# Patient Record
Sex: Female | Born: 1980 | Race: White | Hispanic: No | Marital: Single | Smoking: Current every day smoker
Health system: Southern US, Community
[De-identification: ages and names within clinical notes are randomized; demographics above are authoritative.]

## PROBLEM LIST (undated history)

## (undated) DIAGNOSIS — Z87442 Personal history of urinary calculi: Secondary | ICD-10-CM

## (undated) DIAGNOSIS — M419 Scoliosis, unspecified: Secondary | ICD-10-CM

## (undated) DIAGNOSIS — N200 Calculus of kidney: Secondary | ICD-10-CM

## (undated) DIAGNOSIS — T7840XA Allergy, unspecified, initial encounter: Secondary | ICD-10-CM

## (undated) DIAGNOSIS — M5430 Sciatica, unspecified side: Secondary | ICD-10-CM

## (undated) DIAGNOSIS — N159 Renal tubulo-interstitial disease, unspecified: Secondary | ICD-10-CM

## (undated) DIAGNOSIS — F329 Major depressive disorder, single episode, unspecified: Principal | ICD-10-CM

## (undated) HISTORY — PX: CHOLECYSTECTOMY: SHX55

## (undated) HISTORY — DX: Major depressive disorder, single episode, unspecified: F32.9

## (undated) HISTORY — PX: KIDNEY STONE SURGERY: SHX686

---

## 2005-02-14 ENCOUNTER — Observation Stay (HOSPITAL_COMMUNITY): Admission: EM | Admit: 2005-02-14 | Discharge: 2005-02-16 | Payer: Self-pay | Admitting: Emergency Medicine

## 2008-08-09 ENCOUNTER — Other Ambulatory Visit: Admission: RE | Admit: 2008-08-09 | Discharge: 2008-08-09 | Payer: Self-pay | Admitting: Obstetrics and Gynecology

## 2009-02-21 ENCOUNTER — Inpatient Hospital Stay (HOSPITAL_COMMUNITY): Admission: AD | Admit: 2009-02-21 | Discharge: 2009-02-21 | Payer: Self-pay | Admitting: Obstetrics and Gynecology

## 2009-02-21 ENCOUNTER — Ambulatory Visit: Payer: Self-pay | Admitting: Family Medicine

## 2009-03-01 ENCOUNTER — Ambulatory Visit: Payer: Self-pay | Admitting: Family Medicine

## 2009-03-01 ENCOUNTER — Inpatient Hospital Stay (HOSPITAL_COMMUNITY): Admission: AD | Admit: 2009-03-01 | Discharge: 2009-03-04 | Payer: Self-pay | Admitting: Obstetrics & Gynecology

## 2009-10-11 ENCOUNTER — Inpatient Hospital Stay (HOSPITAL_COMMUNITY): Admission: EM | Admit: 2009-10-11 | Discharge: 2009-10-12 | Payer: Self-pay | Admitting: Emergency Medicine

## 2009-10-12 ENCOUNTER — Encounter (INDEPENDENT_AMBULATORY_CARE_PROVIDER_SITE_OTHER): Payer: Self-pay | Admitting: General Surgery

## 2009-11-25 ENCOUNTER — Emergency Department (HOSPITAL_COMMUNITY): Admission: EM | Admit: 2009-11-25 | Discharge: 2009-11-25 | Payer: Self-pay | Admitting: Emergency Medicine

## 2010-02-27 ENCOUNTER — Emergency Department (HOSPITAL_COMMUNITY)
Admission: EM | Admit: 2010-02-27 | Discharge: 2010-02-27 | Payer: Self-pay | Source: Home / Self Care | Admitting: Emergency Medicine

## 2010-02-27 LAB — URINALYSIS, ROUTINE W REFLEX MICROSCOPIC
Hemoglobin, Urine: NEGATIVE
Ketones, ur: 15 mg/dL — AB
Nitrite: NEGATIVE
Protein, ur: NEGATIVE mg/dL
Specific Gravity, Urine: 1.025 (ref 1.005–1.030)
Urine Glucose, Fasting: NEGATIVE mg/dL
Urobilinogen, UA: 1 mg/dL (ref 0.0–1.0)
pH: 6.5 (ref 5.0–8.0)

## 2010-02-27 LAB — BASIC METABOLIC PANEL
BUN: 6 mg/dL (ref 6–23)
CO2: 26 mEq/L (ref 19–32)
Calcium: 9.9 mg/dL (ref 8.4–10.5)
Chloride: 102 mEq/L (ref 96–112)
Creatinine, Ser: 0.63 mg/dL (ref 0.4–1.2)
GFR calc Af Amer: >60 mL/min (ref >60)
GFR calc non Af Amer: >60 mL/min (ref >60)
Glucose, Bld: 93 mg/dL (ref 70–99)
Potassium: 4.1 mEq/L (ref 3.5–5.1)
Sodium: 138 mEq/L (ref 135–145)

## 2010-02-27 LAB — CBC
HCT: 43.8 % (ref 36.0–46.0)
Hemoglobin: 15.3 g/dL — ABNORMAL HIGH (ref 12.0–15.0)
MCH: 31.8 pg (ref 26.0–34.0)
MCHC: 34.9 g/dL (ref 30.0–36.0)
MCV: 91.1 fL (ref 78.0–100.0)
Platelets: 272 10*3/uL (ref 150–400)
RBC: 4.81 MIL/uL (ref 3.87–5.11)
RDW: 13.6 % (ref 11.5–15.5)
WBC: 5.4 10*3/uL (ref 4.0–10.5)

## 2010-02-27 LAB — PREGNANCY, URINE: Preg Test, Ur: NEGATIVE

## 2010-02-27 LAB — HEPATIC FUNCTION PANEL
ALT: 41 U/L — ABNORMAL HIGH (ref 0–35)
AST: 33 U/L (ref 0–37)
Albumin: 4.6 g/dL (ref 3.5–5.2)
Alkaline Phosphatase: 67 U/L (ref 39–117)
Bilirubin, Direct: 0.2 mg/dL (ref 0.0–0.3)
Indirect Bilirubin: 0.7 mg/dL (ref 0.3–0.9)
Total Bilirubin: 0.9 mg/dL (ref 0.3–1.2)
Total Protein: 8.2 g/dL (ref 6.0–8.3)

## 2010-02-27 LAB — POCT PREGNANCY, URINE: Preg Test, Ur: NEGATIVE

## 2010-02-27 LAB — LIPASE, BLOOD: Lipase: 18 U/L (ref 11–59)

## 2010-02-28 LAB — DIFFERENTIAL
Basophils Absolute: 0 10*3/uL (ref 0.0–0.1)
Basophils Relative: 0 % (ref 0–1)
Eosinophils Absolute: 0.1 10*3/uL (ref 0.0–0.7)
Eosinophils Relative: 1 % (ref 0–5)
Lymphocytes Relative: 23 % (ref 12–46)
Lymphs Abs: 1.2 10*3/uL (ref 0.7–4.0)
Monocytes Absolute: 0.7 10*3/uL (ref 0.1–1.0)
Monocytes Relative: 13 % — ABNORMAL HIGH (ref 3–12)
Neutro Abs: 3.4 10*3/uL (ref 1.7–7.7)
Neutrophils Relative %: 63 % (ref 43–77)

## 2010-05-10 LAB — COMPREHENSIVE METABOLIC PANEL
ALT: 32 U/L (ref 0–35)
AST: 34 U/L (ref 0–37)
Albumin: 4.3 g/dL (ref 3.5–5.2)
Alkaline Phosphatase: 63 U/L (ref 39–117)
BUN: 6 mg/dL (ref 6–23)
CO2: 25 mEq/L (ref 19–32)
Calcium: 10.3 mg/dL (ref 8.4–10.5)
Chloride: 104 mEq/L (ref 96–112)
Creatinine, Ser: 0.62 mg/dL (ref 0.4–1.2)
GFR calc Af Amer: >60 mL/min (ref >60)
GFR calc non Af Amer: >60 mL/min (ref >60)
Glucose, Bld: 96 mg/dL (ref 70–99)
Potassium: 4.4 mEq/L (ref 3.5–5.1)
Sodium: 138 mEq/L (ref 135–145)
Total Bilirubin: 0.7 mg/dL (ref 0.3–1.2)
Total Protein: 7.2 g/dL (ref 6.0–8.3)

## 2010-05-10 LAB — DIFFERENTIAL
Basophils Absolute: 0 10*3/uL (ref 0.0–0.1)
Basophils Relative: 0 % (ref 0–1)
Eosinophils Absolute: 0.3 10*3/uL (ref 0.0–0.7)
Eosinophils Relative: 1 % (ref 0–5)
Lymphocytes Relative: 12 % (ref 12–46)
Lymphs Abs: 2.2 10*3/uL (ref 0.7–4.0)
Monocytes Absolute: 0.9 10*3/uL (ref 0.1–1.0)
Monocytes Relative: 5 % (ref 3–12)
Neutro Abs: 15.4 10*3/uL — ABNORMAL HIGH (ref 1.7–7.7)
Neutrophils Relative %: 82 % — ABNORMAL HIGH (ref 43–77)

## 2010-05-10 LAB — URINALYSIS, ROUTINE W REFLEX MICROSCOPIC
Bilirubin Urine: NEGATIVE
Glucose, UA: NEGATIVE mg/dL
Hgb urine dipstick: NEGATIVE
Ketones, ur: NEGATIVE mg/dL
Nitrite: NEGATIVE
Protein, ur: NEGATIVE mg/dL
Specific Gravity, Urine: 1.02 (ref 1.005–1.030)
Urobilinogen, UA: 0.2 mg/dL (ref 0.0–1.0)
pH: 8 (ref 5.0–8.0)

## 2010-05-10 LAB — CBC
HCT: 43.6 % (ref 36.0–46.0)
Hemoglobin: 14.6 g/dL (ref 12.0–15.0)
MCH: 30.8 pg (ref 26.0–34.0)
MCHC: 33.6 g/dL (ref 30.0–36.0)
MCV: 91.6 fL (ref 78.0–100.0)
Platelets: 342 10*3/uL (ref 150–400)
RBC: 4.76 MIL/uL (ref 3.87–5.11)
RDW: 13.6 % (ref 11.5–15.5)
WBC: 18.8 10*3/uL — ABNORMAL HIGH (ref 4.0–10.5)

## 2010-05-10 LAB — LIPASE, BLOOD: Lipase: 29 U/L (ref 11–59)

## 2010-05-10 LAB — POCT PREGNANCY, URINE: Preg Test, Ur: NEGATIVE

## 2010-05-12 LAB — RPR: RPR Ser Ql: NONREACTIVE

## 2010-05-12 LAB — RH IMMUNE GLOB WKUP(>/=20WKS)(NOT WOMEN'S HOSP): Fetal Screen: NEGATIVE

## 2010-05-12 LAB — CBC
HCT: 29.5 % — ABNORMAL LOW (ref 36.0–46.0)
HCT: 40.3 % (ref 36.0–46.0)
Hemoglobin: 10.2 g/dL — ABNORMAL LOW (ref 12.0–15.0)
Hemoglobin: 13.7 g/dL (ref 12.0–15.0)
MCHC: 34 g/dL (ref 30.0–36.0)
MCHC: 34.4 g/dL (ref 30.0–36.0)
MCV: 93 fL (ref 78.0–100.0)
MCV: 93.7 fL (ref 78.0–100.0)
Platelets: 187 10*3/uL (ref 150–400)
Platelets: 241 10*3/uL (ref 150–400)
RBC: 3.15 MIL/uL — ABNORMAL LOW (ref 3.87–5.11)
RBC: 4.33 MIL/uL (ref 3.87–5.11)
RDW: 13.7 % (ref 11.5–15.5)
RDW: 14.3 % (ref 11.5–15.5)
WBC: 20 10*3/uL — ABNORMAL HIGH (ref 4.0–10.5)
WBC: 21.1 10*3/uL — ABNORMAL HIGH (ref 4.0–10.5)

## 2010-07-12 NOTE — H&P (Signed)
NAME:  Karla Oconnor, HUYETT               ACCOUNT NO.:  [id number]   MEDICAL RECORD NO.:  [id number]          PATIENT TYPE:  INP   LOCATION:  A206                          FACILITY:  [facility]   PHYSICIAN:  Mobolaji B. Bakare, M.D.DATE OF BIRTH:  [birth year]   DATE OF ADMISSION:  02/15/2005  DATE OF DISCHARGE:  02/16/2005                                HISTORY & PHYSICAL   PRIMARY CARE PHYSICIAN:  Dr. Sherryll Burger.   CHIEF COMPLAINT:  Myalgias.   HISTORY OF PRESENT ILLNESS:  Karla Oconnor is a 30 year old Caucasian female who  has been on chronically addicted to hydrocodone. She uses 20 to 30 tablets  of 7.5 to 10 of hydrocodone every day. She gets this from Vicodin, Lorcet,  Lortab. Sometimes when she runs out of hydrocodone-based tablets, she uses  Percocet. She has tried OxyContin in the past, but she does not care for  this. She decided to quit using these drugs; last use was about 30 hours  ago. The patient started experiencing diarrhea, rhinorrhea, watery eyes,  myalgia, abdominal cramps and tremors in the past 24 hours. There has been  no vomiting and no nausea. She has moved her bowel about three times today.  She is currently feeling very miserable. She has no appetite. There is no  fever, no chills.  The patient denies IV drug use. She does not use cocaine, heroin, marijuana.   REVIEW OF SYSTEMS:  No cough, chest pain, shortness of breath, headaches. No  pedal swelling. There is no dysuria, urgency, or increased frequency of  micturition.   PAST MEDICAL HISTORY:  Migraine headaches.   PAST SURGICAL HISTORY:  1.  Dental surgery.  2.  Stretching of ureter at the age of 75 months.   MEDICATIONS:  No routine medications except for Vicodin, Lortab and Lorcet  which she uses for recreation.   SOCIAL HISTORY:  As mentioned in the HPI, she does not drink alcohol. She  smokes cigarettes one pack per day, and she has been smoking for 10 years.  The patient is not keen on quitting smoking. She  dropped out of college when  she developed dental problems and had to undergo dental surgery. 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 She changed her job some months  back and lost her current job two to three months ago. She currently lives  with her mother.   FAMILY HISTORY:  Mother has diabetes mellitus. No known ailment with her  father.   GYNECOLOGICAL HISTORY:  The patient's last menstrual period was end of  November, starting menstruating in fifth grade.   PHYSICAL EXAMINATION:  VITAL SIGNS:  Temperature of 98.7, blood pressure  115/71, pulse 107 - currently 80 beats per minute, respiratory rate of 18.  O2 saturations of 98% on room air.  GENERAL:  On examination, the patient is not in respiratory distress.  Normocephalic, atraumatic head. She is restless.  LUNGS:  Clear clinically to auscultation.  CARDIOVASCULAR:  S1 and S2 regular. No murmur, no gallop, no rub.  ABDOMEN:  Nondistended, soft, nontender, no hepatosplenomegaly. Of note is  that  she has got pierced naval with rings.  EXTREMITIES:  No pedal edema. No calf tenderness.  MUSCULOSKELETAL:  She has generalized tenderness. There is no swelling.  SKIN:  Multiple tattoos on her trunk and lower extremities.  CENTRAL NERVOUS SYSTEM:  Oriented to time, place and person. No focal  neurological deficit.   LABORATORY DATA:  White blood cells 12.6, hemoglobin 15.3, hematocrit 44.3,  platelets 418, absolute neutrophil count 9.2. Sodium 133, potassium 4.3,  chloride 96, CO2 25, glucose 116, BUN 12, creatinine 1.6, calcium 10.0.  Alcohol level less than 5. HCG pregnancy test, urine, negative. Urinalysis:  Clear in appearance, specific gravity 1.005, positive for nitrites. Urine  drug screen:  Positive for opiates. Urine microscopy:  Many bacteria, rare  epithelial cells.   ASSESSMENT:  Ms. Karla Oconnor is a 30 year old Caucasian female presenting  with symptoms compatible with opiate withdrawal. She has been  heavily  dependent on hydrocodone. She is keen to have detoxification. She has been  seen by the ACT Team. Unfortunately, there is no unit that will accept her  today. She will be admitted for symptom control.   1.  Opioid withdrawal.  2.  Opioid dependence.  3.  Tobacco abuse.  4.  Mild hyponatremia.  5.  Asymptomatic bacteruria.   PLAN:  Admit to telemetry. IV fluid normal saline at 60 cc per hour.  Diazepam 5 mg p.o. b.i.d.; hold for sedation. Loperamide 2 tablets p.r.n.  for loose stools. Phenergan 12.5 mg IV q.4h. p.r.n. nausea and vomiting.  Tylenol 650 mg p.o. q.4-6h. p.r.n. for headaches and fever. Will check liver  function tests and acetaminophen level in view of chronic use of  hydrocodone/Tylenol combination. When the patient's symptoms resolve, the  patient's mom has been given information as to rehabilitation as an  outpatient. Did state that she has to pay for rehabilitation and this is  going to arrange as outpatient because patient does not have insurance. Will  give nicotine patch 24 mg daily. Check urine culture.      Mobolaji B. Corky Downs, M.D.  Electronically Signed     MBB/MEDQ  D:  02/15/2005  T:  02/15/2005  Job:  [id number]

## 2010-08-23 ENCOUNTER — Emergency Department (HOSPITAL_COMMUNITY)
Admission: EM | Admit: 2010-08-23 | Discharge: 2010-08-23 | Disposition: A | Discharge status: Home / Self Care | Payer: Medicaid - Out of State | Attending: Emergency Medicine | Admitting: Emergency Medicine

## 2010-08-23 ENCOUNTER — Emergency Department (HOSPITAL_COMMUNITY): Payer: Medicaid - Out of State

## 2010-08-23 DIAGNOSIS — R300 Dysuria: Secondary | ICD-10-CM | POA: Insufficient documentation

## 2010-08-23 DIAGNOSIS — R509 Fever, unspecified: Secondary | ICD-10-CM | POA: Insufficient documentation

## 2010-08-23 DIAGNOSIS — N12 Tubulo-interstitial nephritis, not specified as acute or chronic: Secondary | ICD-10-CM | POA: Insufficient documentation

## 2010-08-23 DIAGNOSIS — R109 Unspecified abdominal pain: Secondary | ICD-10-CM | POA: Insufficient documentation

## 2010-08-23 DIAGNOSIS — R110 Nausea: Secondary | ICD-10-CM | POA: Insufficient documentation

## 2010-08-23 LAB — URINALYSIS, ROUTINE W REFLEX MICROSCOPIC
Bilirubin Urine: NEGATIVE
Glucose, UA: NEGATIVE mg/dL
Ketones, ur: NEGATIVE mg/dL
Nitrite: NEGATIVE
Protein, ur: NEGATIVE mg/dL
Specific Gravity, Urine: <1.005 — ABNORMAL LOW (ref 1.005–1.030)
Urobilinogen, UA: 0.2 mg/dL (ref 0.0–1.0)
pH: 6.5 (ref 5.0–8.0)

## 2010-08-23 LAB — BASIC METABOLIC PANEL
BUN: 10 mg/dL (ref 6–23)
CO2: 26 mEq/L (ref 19–32)
Calcium: 9.6 mg/dL (ref 8.4–10.5)
Chloride: 103 mEq/L (ref 96–112)
Creatinine, Ser: 0.57 mg/dL (ref 0.50–1.10)
GFR calc Af Amer: >60 mL/min (ref >60)
GFR calc non Af Amer: >60 mL/min (ref >60)
Glucose, Bld: 94 mg/dL (ref 70–99)
Potassium: 3.8 mEq/L (ref 3.5–5.1)
Sodium: 138 mEq/L (ref 135–145)

## 2010-08-23 LAB — URINE MICROSCOPIC-ADD ON

## 2010-08-23 LAB — POCT PREGNANCY, URINE: Preg Test, Ur: NEGATIVE

## 2010-08-24 LAB — URINE CULTURE

## 2010-10-22 ENCOUNTER — Emergency Department (HOSPITAL_COMMUNITY)
Admission: EM | Admit: 2010-10-22 | Discharge: 2010-10-23 | Disposition: A | Discharge status: Home / Self Care | Payer: Medicaid - Out of State | Attending: Emergency Medicine | Admitting: Emergency Medicine

## 2010-10-22 ENCOUNTER — Encounter: Payer: Self-pay | Admitting: Emergency Medicine

## 2010-10-22 DIAGNOSIS — M549 Dorsalgia, unspecified: Secondary | ICD-10-CM

## 2010-10-22 DIAGNOSIS — Y9269 Other specified industrial and construction area as the place of occurrence of the external cause: Secondary | ICD-10-CM | POA: Insufficient documentation

## 2010-10-22 DIAGNOSIS — F17200 Nicotine dependence, unspecified, uncomplicated: Secondary | ICD-10-CM | POA: Insufficient documentation

## 2010-10-22 DIAGNOSIS — M545 Low back pain, unspecified: Secondary | ICD-10-CM | POA: Insufficient documentation

## 2010-10-22 DIAGNOSIS — M4120 Other idiopathic scoliosis, site unspecified: Secondary | ICD-10-CM | POA: Insufficient documentation

## 2010-10-22 DIAGNOSIS — W010XXA Fall on same level from slipping, tripping and stumbling without subsequent striking against object, initial encounter: Secondary | ICD-10-CM | POA: Insufficient documentation

## 2010-10-22 DIAGNOSIS — R110 Nausea: Secondary | ICD-10-CM | POA: Insufficient documentation

## 2010-10-22 DIAGNOSIS — M5430 Sciatica, unspecified side: Secondary | ICD-10-CM | POA: Insufficient documentation

## 2010-10-22 HISTORY — DX: Sciatica, unspecified side: M54.30

## 2010-10-22 HISTORY — DX: Scoliosis, unspecified: M41.9

## 2010-10-22 NOTE — ED Notes (Signed)
Pain rt buttock and down leg after fall at work at 930p

## 2010-10-22 NOTE — ED Provider Notes (Signed)
History     CSN: [id number] Arrival date & time: 10/22/2010 10:52 PM  Chief Complaint  Patient presents with  . Fall  . Back Pain   HPI Comments: Seen 2325.  Patient is a 30 y.o. female presenting with fall. The history is provided by the patient.  Fall Incident onset: 2130 at work. Slipped on water and grease. 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 Does not want to file worker's comp. The fall occurred while walking. She fell from a height of 3 to 5 ft. She landed on concrete. Point of impact: fell in a twisting motion on her right back. Pain location: right hip with radiation down the middle of the buttock to her knee. The pain is at a severity of 10/10. The pain is severe. She was ambulatory at the scene. There was no entrapment after the fall. There was no drug use involved in the accident. There was no alcohol use involved in the accident. Associated symptoms include nausea. The symptoms are aggravated by activity, standing, flexion and rotation. She has tried nothing for the symptoms.    Past Medical History  Diagnosis Date  . Sciatica   . Scoliosis     Past Surgical History  Procedure Date  . Kidney stone surgery   . Cesarean section     No family history on file.  History  Substance Use Topics  . Smoking status: Current Everyday Smoker -- 1.0 packs/day    Types: Cigarettes  . Smokeless tobacco: Not on file  . Alcohol Use: No    OB History    Grav Para Term Preterm Abortions TAB SAB Ect Mult Living                  Review of Systems  Gastrointestinal: Positive for nausea.  Musculoskeletal: Positive for back pain.  All other systems reviewed and are negative.    Physical Exam  BP 126/86  Pulse 108  Temp(Src) 99 F (37.2 C) (Oral)  Resp 16  Ht 5\' 3"  (1.6 m)  Wt 125 lb (56.7 kg)  BMI 22.14 kg/m2  SpO2 100%  LMP 10/02/2010  Physical Exam  Nursing note and vitals reviewed. Constitutional: She appears well-developed and well-nourished. She  appears distressed.  HENT:  Head: Normocephalic and atraumatic.  Eyes: EOM are normal. Pupils are equal, round, and reactive to light.  Neck: Normal range of motion. Neck supple.  Cardiovascular: Normal rate, normal heart sounds and intact distal pulses.   Pulmonary/Chest: Effort normal and breath sounds normal.  Abdominal: Soft.  Musculoskeletal:       Pain to  Lumbar paraspinal muscles on right. NO c spine, t spine or LS spine tenderness to percussion. Unable to bend over to touch toes or bend laterally due to discomfort. Sensation normal. Pulses 2+.  Neurological: She is alert. She has normal reflexes.  Skin: Skin is warm and dry.    ED Course  Procedures  Dg Lumbar Spine Complete  10/23/2010  *RADIOLOGY REPORT*  Clinical Data: Fall, low back pain  LUMBAR SPINE - COMPLETE 4+ VIEW  Comparison:  None.  Findings:  There is no evidence of lumbar spine fracture. Alignment is normal.  Intervertebral disc spaces are maintained.  IMPRESSION: Negative.  Original Report Authenticated By: Harrel Lemon, M.D.    Patient with h/o sciatica and back pain here s/p fall resulting in lower back pain. Given analgesics x 2 with partial relief. Reviewed results with patient. Patient  informed of clinical course, understand  medical decision-making process, and agree with plan.Pt feels improved after observation and/or treatment in ED.  MDM Reviewed: nursing note and vitals Interpretation: x-ray     Nicoletta Dress. Colon Branch, MD 10/23/10 [phone]

## 2010-10-22 NOTE — ED Notes (Signed)
Patient states she fell and c/o lower back pain.  States she fell in water and grease.

## 2010-10-22 NOTE — ED Notes (Signed)
Alert, nad at present

## 2010-10-23 ENCOUNTER — Emergency Department (HOSPITAL_COMMUNITY): Payer: Medicaid - Out of State

## 2010-10-23 ENCOUNTER — Encounter (HOSPITAL_COMMUNITY): Payer: Self-pay | Admitting: *Deleted

## 2010-10-23 LAB — POCT PREGNANCY, URINE: Preg Test, Ur: NEGATIVE

## 2010-10-23 MED ORDER — HYDROMORPHONE HCL 1 MG/ML IJ SOLN
1.0000 mg | Freq: Once | INTRAMUSCULAR | Status: AC
  Administered 2010-10-23: 1 mg via INTRAMUSCULAR
  Filled 2010-10-23: qty 1

## 2010-10-23 MED ORDER — ACETAMINOPHEN 325 MG PO TABS
650.0000 mg | ORAL_TABLET | Freq: Once | ORAL | Status: AC
  Administered 2010-10-23: 650 mg via ORAL
  Filled 2010-10-23: qty 2

## 2010-10-23 MED ORDER — HYDROCODONE-ACETAMINOPHEN 5-325 MG PO TABS: 2.0000 | ORAL_TABLET | ORAL | Status: AC | PRN

## 2010-10-23 MED ORDER — ONDANSETRON HCL 4 MG PO TABS
4.0000 mg | ORAL_TABLET | Freq: Once | ORAL | Status: AC
  Administered 2010-10-23: 4 mg via ORAL
  Filled 2010-10-23: qty 1

## 2010-10-23 NOTE — ED Notes (Signed)
Pt self ambulated out with a steady gait with her brother stating no needs

## 2010-12-18 ENCOUNTER — Encounter (HOSPITAL_COMMUNITY): Payer: Self-pay | Admitting: *Deleted

## 2010-12-18 ENCOUNTER — Emergency Department (HOSPITAL_COMMUNITY)
Admission: EM | Admit: 2010-12-18 | Discharge: 2010-12-18 | Disposition: A | Discharge status: Home / Self Care | Payer: Medicaid - Out of State | Attending: Emergency Medicine | Admitting: Emergency Medicine

## 2010-12-18 DIAGNOSIS — J40 Bronchitis, not specified as acute or chronic: Secondary | ICD-10-CM

## 2010-12-18 DIAGNOSIS — J069 Acute upper respiratory infection, unspecified: Secondary | ICD-10-CM | POA: Insufficient documentation

## 2010-12-18 DIAGNOSIS — F17200 Nicotine dependence, unspecified, uncomplicated: Secondary | ICD-10-CM | POA: Insufficient documentation

## 2010-12-18 DIAGNOSIS — M4120 Other idiopathic scoliosis, site unspecified: Secondary | ICD-10-CM | POA: Insufficient documentation

## 2010-12-18 DIAGNOSIS — Z87442 Personal history of urinary calculi: Secondary | ICD-10-CM | POA: Insufficient documentation

## 2010-12-18 DIAGNOSIS — M5430 Sciatica, unspecified side: Secondary | ICD-10-CM | POA: Insufficient documentation

## 2010-12-18 HISTORY — DX: Calculus of kidney: N20.0

## 2010-12-18 MED ORDER — FEXOFENADINE-PSEUDOEPHED ER 60-120 MG PO TB12: 1.0000 | ORAL_TABLET | Freq: Two times a day (BID) | ORAL | Status: DC

## 2010-12-18 MED ORDER — PROMETHAZINE-CODEINE 6.25-10 MG/5ML PO SYRP: 5.0000 mL | ORAL_SOLUTION | Freq: Four times a day (QID) | ORAL | Status: AC | PRN

## 2010-12-18 MED ORDER — PREDNISONE 10 MG PO TABS: ORAL_TABLET | ORAL | Status: DC

## 2010-12-18 MED ORDER — AMOXICILLIN 500 MG PO CAPS: 500.0000 mg | ORAL_CAPSULE | Freq: Three times a day (TID) | ORAL | Status: AC

## 2010-12-18 NOTE — ED Provider Notes (Signed)
History     CSN: [id number] Arrival date & time: 12/18/2010  4:01 PM   First MD Initiated Contact with Patient 12/18/10 [phone]      Chief Complaint  Patient presents with  . Shortness of Breath  . Nasal Congestion    (Consider location/radiation/quality/duration/timing/severity/associated sxs/prior treatment) Patient is a 30 y.o. female presenting with shortness of breath. The history is provided by the patient.  Shortness of Breath  The current episode started 3 to 5 days ago. The problem occurs frequently. The problem is moderate. The symptoms are relieved by nothing. Exacerbated by: coughing. Associated symptoms include chest pain, a fever, rhinorrhea, cough and shortness of breath. Pertinent negatives include no wheezing. There was no intake of a foreign body. She has had no prior steroid use. She has had no prior ICU admissions. She has had no prior intubations. Her past medical history is significant for bronchiolitis. She has been behaving normally. Urine output has been normal. There were sick contacts at home. She has received no recent medical care.    Past Medical History  Diagnosis Date  . Sciatica   . Scoliosis   . Kidney stone     Past Surgical History  Procedure Date  . Kidney stone surgery   . Cesarean section   . Cholecystectomy     History reviewed. No pertinent family history.  History  Substance Use Topics  . Smoking status: Current Everyday Smoker -- 1.0 packs/day    Types: Cigarettes  . Smokeless tobacco: Not on file  . Alcohol Use: No    OB History    Grav Para Term Preterm Abortions TAB SAB Ect Mult Living                  Review of Systems  Constitutional: Positive for fever. Negative for activity change.       All ROS Neg except as noted in HPI  HENT: Positive for rhinorrhea. Negative for nosebleeds and neck pain.   Eyes: Negative for photophobia and discharge.  Respiratory: Positive for cough and shortness of breath. Negative for  wheezing.   Cardiovascular: Positive for chest pain. Negative for palpitations.  Gastrointestinal: Negative for abdominal pain and blood in stool.  Genitourinary: Negative for dysuria, frequency and hematuria.  Musculoskeletal: Negative for back pain and arthralgias.  Skin: Negative.   Neurological: Negative for dizziness, seizures and speech difficulty.  Psychiatric/Behavioral: Negative for hallucinations and confusion.    Allergies  Nubain; Sulfa antibiotics; and Toradol  Home Medications   Current Outpatient Rx  Name Route Sig Dispense Refill  . ACETAMINOPHEN 325 MG PO TABS Oral Take 775 mg by mouth once as needed. For fever     . NITROFURANTOIN MACROCRYSTAL 100 MG PO CAPS Oral Take 100 mg by mouth daily.      . AMOXICILLIN 500 MG PO CAPS Oral Take 1 capsule (500 mg total) by mouth 3 (three) times daily. 21 capsule 0  . FEXOFENADINE-PSEUDOEPHEDRINE 60-120 MG PO TB12 Oral Take 1 tablet by mouth every 12 (twelve) hours. 20 tablet 0  . PREDNISONE 10 MG PO TABS  6,5,4,3,2,1 - take with food 15 tablet 0  . PROMETHAZINE-CODEINE 6.25-10 MG/5ML PO SYRP Oral Take 5 mLs by mouth every 6 (six) hours as needed for cough. 120 mL 0    BP 108/74  Pulse 93  Temp(Src) 99 F (37.2 C) (Oral)  Resp 18  Ht 5\' 3"  (1.6 m)  Wt 120 lb (54.432 kg)  BMI 21.26 kg/m2  SpO2 99%  LMP 12/07/2010  Physical Exam  Nursing note and vitals reviewed. Constitutional: She is oriented to person, place, and time. She appears well-developed and well-nourished.  Non-toxic appearance.  HENT:  Head: Normocephalic.  Right Ear: Tympanic membrane and external ear normal.  Left Ear: Tympanic membrane and external ear normal.       Nasal congestion  Eyes: EOM and lids are normal. Pupils are equal, round, and reactive to light.  Neck: Normal range of motion. Neck supple. Carotid bruit is not present.  Cardiovascular: Normal rate, regular rhythm, normal heart sounds, intact distal pulses and normal pulses.     Pulmonary/Chest: No respiratory distress. She has rhonchi.    Abdominal: Soft. Bowel sounds are normal. There is no tenderness. There is no guarding.  Musculoskeletal: Normal range of motion.  Lymphadenopathy:       Head (right side): No submandibular adenopathy present.       Head (left side): No submandibular adenopathy present.    She has no cervical adenopathy.  Neurological: She is alert and oriented to person, place, and time. She has normal strength. No cranial nerve deficit or sensory deficit.  Skin: Skin is warm and dry.  Psychiatric: She has a normal mood and affect. Her speech is normal.    ED Course  Procedures (including critical care time)  Labs Reviewed - No data to display No results found.   1. Bronchitis   2. URI (upper respiratory infection)       MDM  I have reviewed nursing notes, vital signs, and all appropriate lab and imaging results for this patient.        Kathie Dike, [place] 12/18/10 [phone]

## 2010-12-18 NOTE — ED Provider Notes (Signed)
Medical screening examination/treatment/procedure(s) were performed by non-physician practitioner and as supervising physician I was immediately available for consultation/collaboration.  Aliz Meritt R. Adeleigh Barletta, MD 12/18/10 2355 

## 2010-12-18 NOTE — ED Notes (Signed)
Pt c/o body aches fever and sharp pain when she coughs.

## 2011-01-22 ENCOUNTER — Emergency Department (HOSPITAL_COMMUNITY)
Admission: EM | Admit: 2011-01-22 | Discharge: 2011-01-22 | Disposition: A | Discharge status: Home / Self Care | Payer: Medicaid - Out of State | Attending: Emergency Medicine | Admitting: Emergency Medicine

## 2011-01-22 ENCOUNTER — Emergency Department (HOSPITAL_COMMUNITY): Payer: Medicaid - Out of State

## 2011-01-22 ENCOUNTER — Encounter (HOSPITAL_COMMUNITY): Payer: Self-pay | Admitting: *Deleted

## 2011-01-22 DIAGNOSIS — R109 Unspecified abdominal pain: Secondary | ICD-10-CM | POA: Insufficient documentation

## 2011-01-22 DIAGNOSIS — M4120 Other idiopathic scoliosis, site unspecified: Secondary | ICD-10-CM | POA: Insufficient documentation

## 2011-01-22 DIAGNOSIS — M5430 Sciatica, unspecified side: Secondary | ICD-10-CM | POA: Insufficient documentation

## 2011-01-22 DIAGNOSIS — Z87442 Personal history of urinary calculi: Secondary | ICD-10-CM | POA: Insufficient documentation

## 2011-01-22 DIAGNOSIS — R000 Tachycardia, unspecified: Secondary | ICD-10-CM | POA: Insufficient documentation

## 2011-01-22 DIAGNOSIS — F17200 Nicotine dependence, unspecified, uncomplicated: Secondary | ICD-10-CM | POA: Insufficient documentation

## 2011-01-22 LAB — URINALYSIS, ROUTINE W REFLEX MICROSCOPIC
Glucose, UA: NEGATIVE mg/dL
Leukocytes, UA: NEGATIVE
Nitrite: NEGATIVE
Protein, ur: NEGATIVE mg/dL

## 2011-01-22 LAB — DIFFERENTIAL
Basophils Absolute: 0 10*3/uL (ref 0.0–0.1)
Basophils Relative: 0 % (ref 0–1)
Lymphocytes Relative: 21 % (ref 12–46)
Monocytes Absolute: 0.7 10*3/uL (ref 0.1–1.0)
Neutro Abs: 10.1 10*3/uL — ABNORMAL HIGH (ref 1.7–7.7)
Neutrophils Relative %: 73 % (ref 43–77)

## 2011-01-22 LAB — CBC
HCT: 42.3 % (ref 36.0–46.0)
MCHC: 34.3 g/dL (ref 30.0–36.0)
Platelets: 371 10*3/uL (ref 150–400)
RDW: 13 % (ref 11.5–15.5)
WBC: 13.9 10*3/uL — ABNORMAL HIGH (ref 4.0–10.5)

## 2011-01-22 LAB — BASIC METABOLIC PANEL
CO2: 26 mEq/L (ref 19–32)
Chloride: 100 mEq/L (ref 96–112)
GFR calc Af Amer: >90 mL/min (ref >90)
Potassium: 3.4 mEq/L — ABNORMAL LOW (ref 3.5–5.1)
Sodium: 137 mEq/L (ref 135–145)

## 2011-01-22 LAB — PREGNANCY, URINE: Preg Test, Ur: NEGATIVE

## 2011-01-22 LAB — URINE MICROSCOPIC-ADD ON

## 2011-01-22 MED ORDER — FENTANYL CITRATE 0.05 MG/ML IJ SOLN
50.0000 ug | Freq: Once | INTRAMUSCULAR | Status: AC
  Administered 2011-01-22: 50 ug via INTRAVENOUS
  Filled 2011-01-22: qty 2

## 2011-01-22 MED ORDER — ONDANSETRON HCL 4 MG/2ML IJ SOLN
4.0000 mg | Freq: Once | INTRAMUSCULAR | Status: AC
  Administered 2011-01-22: 4 mg via INTRAVENOUS
  Filled 2011-01-22: qty 2

## 2011-01-22 MED ORDER — PROMETHAZINE HCL 25 MG PO TABS: 12.5000 mg | ORAL_TABLET | Freq: Four times a day (QID) | ORAL | Status: AC | PRN

## 2011-01-22 MED ORDER — HYDROMORPHONE HCL PF 1 MG/ML IJ SOLN
1.0000 mg | Freq: Once | INTRAMUSCULAR | Status: AC
  Administered 2011-01-22: 1 mg via INTRAVENOUS
  Filled 2011-01-22: qty 1

## 2011-01-22 MED ORDER — OXYCODONE-ACETAMINOPHEN 5-325 MG PO TABS: 2.0000 | ORAL_TABLET | ORAL | Status: AC | PRN

## 2011-01-22 MED ORDER — CIPROFLOXACIN HCL 250 MG PO TABS
500.0000 mg | ORAL_TABLET | Freq: Once | ORAL | Status: AC
  Administered 2011-01-22: 500 mg via ORAL
  Filled 2011-01-22: qty 2

## 2011-01-22 NOTE — ED Notes (Signed)
Pt c/o stabbing pain that radiates from lower right back to groin. Pt states pain began 2 days ago and has gotten worse.

## 2011-01-22 NOTE — ED Provider Notes (Signed)
History    This chart was scribed for [employer]. Colon Branch, MD, MD by Smitty Pluck. The patient was seen in room APA06 and the patient's care was started at 3:13PM.    CSN: [id number] Arrival date & time: 01/22/2011  1:34 PM   First MD Initiated Contact with Patient 01/22/11 1501      Chief Complaint  Patient presents with  . Flank Pain    (Consider location/radiation/quality/duration/timing/severity/associated sxs/prior treatment) HPI Karla Oconnor is a 30 y.o. female who presents to the Emergency Department complaining of constant moderate to severe right flank pain radiating towards abdomen onset two days ago. Pt has associated symptoms of nausea and vomiting starting today 4 hours ago. Pt has taken endocets for pain last night without relief. Pt denies fever and chills.  Pt has a history of kidney stones with last kidney stone occuring 5 months ago which she passed. She currently takes antibiotic medication (Macrobid) for chronic kidney infections.  Dr. Peggye Pitt is urologist.  Past Medical History  Diagnosis Date  . Sciatica   . Scoliosis   . Kidney stone     Past Surgical History  Procedure Date  . Kidney stone surgery   . Cesarean section   . Cholecystectomy     History reviewed. No pertinent family history.  History  Substance Use Topics  . Smoking status: Current Everyday Smoker -- 1.0 packs/day    Types: Cigarettes  . Smokeless tobacco: Not on file  . Alcohol Use: No    OB History    Grav Para Term Preterm Abortions TAB SAB Ect Mult Living                  Review of Systems  All other systems reviewed and are negative.   .10 Systems reviewed and are negative for acute change except as noted in the HPI.  Allergies  Codeine; Nubain; Sulfa antibiotics; and Toradol  Home Medications   Current Outpatient Rx  Name Route Sig Dispense Refill  . NITROFURANTOIN MACROCRYSTAL 100 MG PO CAPS Oral Take 100 mg by mouth daily.      . OXYCODONE-ACETAMINOPHEN  10-650 MG PO TABS Oral Take 1 tablet by mouth every 6 (six) hours as needed. pain       BP 106/65  Pulse 88  Temp(Src) 99 F (37.2 C) (Oral)  Resp 16  Ht 5\' 3"  (1.6 m)  Wt 125 lb (56.7 kg)  BMI 22.14 kg/m2  SpO2 100%  LMP 01/05/2011  Physical Exam  Nursing note and vitals reviewed. Constitutional: She is oriented to person, place, and time. She appears well-developed and well-nourished. No distress.  HENT:  Head: Normocephalic and atraumatic.       TMs normal bilaterally.   Eyes: EOM are normal. Pupils are equal, round, and reactive to light.  Neck: Neck supple. No tracheal deviation present.  Cardiovascular: Regular rhythm and normal heart sounds.        tachycardia  Pulmonary/Chest: Effort normal and breath sounds normal. No respiratory distress. She has no wheezes. She has no rales.  Abdominal: Soft. Bowel sounds are normal. She exhibits no distension. There is tenderness (right side with palpation ).       Significant right CVA tenderness to percussion.  Neurological: She is alert and oriented to person, place, and time.  Skin: Skin is warm and dry.  Psychiatric: She has a normal mood and affect. Her behavior is normal.    ED Course  Procedures (including critical care time) DIAGNOSTIC STUDIES: Oxygen  Saturation is 100% on room air, normal by my interpretation.    COORDINATION OF CARE: 5:07PM Recheck. Patient states analgesics improved pain. Nausea and vomiting are relieved.Dr discusses test results and medication course (pain medication and pill form of antibiotics) with patient. Pt is ready for discharge   Labs Reviewed  URINALYSIS, ROUTINE W REFLEX MICROSCOPIC - Abnormal; Notable for the following:    APPearance HAZY (*)    Hgb urine dipstick LARGE (*)    Bilirubin Urine SMALL (*)    Ketones, ur 40 (*)    All other components within normal limits  CBC - Abnormal; Notable for the following:    WBC 13.9 (*)    All other components within normal limits    DIFFERENTIAL - Abnormal; Notable for the following:    Neutro Abs 10.1 (*)    All other components within normal limits  BASIC METABOLIC PANEL - Abnormal; Notable for the following:    Potassium 3.4 (*)    All other components within normal limits  URINE MICROSCOPIC-ADD ON - Abnormal; Notable for the following:    Bacteria, UA FEW (*)    All other components within normal limits  PREGNANCY, URINE   Ct Abdomen Pelvis Wo Contrast  01/22/2011  *RADIOLOGY REPORT*  Clinical Data: 30 year old female with right flank, abdominal and pelvic pain.  History of renal calculi.  CT ABDOMEN AND PELVIS WITHOUT CONTRAST  Technique:  Multidetector CT imaging of the abdomen and pelvis was performed following the standard protocol without intravenous contrast.  Comparison: 08/23/2010  Findings: The liver, spleen, adrenal glands, pancreas and kidneys are unremarkable. There is no evidence of hydronephrosis or urinary calculi. Please note that parenchymal abnormalities may be missed as intravenous contrast was not administered. Cholecystectomy identified.  No free fluid, enlarged lymph nodes, biliary dilation or abdominal aortic aneurysm identified. The bowel and bladder are unremarkable. No acute or suspicious bony abnormalities are identified.  IMPRESSION: No acute or significant abnormalities identified.  Original Report Authenticated By: Rosendo Gros, M.D.     No diagnosis found.    MDM  Patient presents with right flank pain and history of kidney stones. IVF and analgesics intiated. CT negative for renal or ureteral stones. No acute findings on CT. Patient better with Analgesic.Pt feels improved after observation and/or treatment in ED.Pt stable in ED with no significant deterioration in condition.The patient appears reasonably screened and/or stabilized for discharge and I doubt any other medical condition or other [facility] requiring further screening, evaluation, or treatment in the ED at this time prior to  discharge.  MDM Reviewed: nursing note and vitals Interpretation: labs and CT scan  I personally performed the services described in this documentation, which was scribed in my presence. The recorded information has been reviewed and considered.          Nicoletta Dress. Colon Branch, MD 01/22/11 1742

## 2011-01-22 NOTE — ED Notes (Signed)
Dr. Estell Harpin aware of pt pain and unchanged by pain medication given per severe pain protocol.

## 2011-03-23 ENCOUNTER — Emergency Department (HOSPITAL_COMMUNITY): Payer: Medicaid - Out of State

## 2011-03-23 ENCOUNTER — Encounter (HOSPITAL_COMMUNITY): Payer: Self-pay | Admitting: Emergency Medicine

## 2011-03-23 ENCOUNTER — Inpatient Hospital Stay (HOSPITAL_COMMUNITY)
Admission: EM | Admit: 2011-03-23 | Discharge: 2011-03-27 | DRG: 690 | Disposition: A | Discharge status: Home / Self Care | Payer: Medicaid - Out of State | Attending: Internal Medicine | Admitting: Internal Medicine

## 2011-03-23 DIAGNOSIS — N12 Tubulo-interstitial nephritis, not specified as acute or chronic: Secondary | ICD-10-CM | POA: Diagnosis present

## 2011-03-23 DIAGNOSIS — B3731 Acute candidiasis of vulva and vagina: Secondary | ICD-10-CM | POA: Diagnosis not present

## 2011-03-23 DIAGNOSIS — A498 Other bacterial infections of unspecified site: Secondary | ICD-10-CM | POA: Diagnosis present

## 2011-03-23 DIAGNOSIS — N10 Acute tubulo-interstitial nephritis: Principal | ICD-10-CM | POA: Diagnosis present

## 2011-03-23 DIAGNOSIS — M7989 Other specified soft tissue disorders: Secondary | ICD-10-CM | POA: Diagnosis not present

## 2011-03-23 DIAGNOSIS — T368X5A Adverse effect of other systemic antibiotics, initial encounter: Secondary | ICD-10-CM | POA: Diagnosis not present

## 2011-03-23 DIAGNOSIS — E860 Dehydration: Secondary | ICD-10-CM | POA: Diagnosis present

## 2011-03-23 DIAGNOSIS — Z720 Tobacco use: Secondary | ICD-10-CM | POA: Diagnosis present

## 2011-03-23 DIAGNOSIS — R112 Nausea with vomiting, unspecified: Secondary | ICD-10-CM | POA: Diagnosis present

## 2011-03-23 DIAGNOSIS — M4120 Other idiopathic scoliosis, site unspecified: Secondary | ICD-10-CM | POA: Diagnosis present

## 2011-03-23 DIAGNOSIS — F1121 Opioid dependence, in remission: Secondary | ICD-10-CM | POA: Diagnosis present

## 2011-03-23 DIAGNOSIS — E871 Hypo-osmolality and hyponatremia: Secondary | ICD-10-CM | POA: Diagnosis present

## 2011-03-23 DIAGNOSIS — F17200 Nicotine dependence, unspecified, uncomplicated: Secondary | ICD-10-CM | POA: Diagnosis present

## 2011-03-23 DIAGNOSIS — B373 Candidiasis of vulva and vagina: Secondary | ICD-10-CM | POA: Diagnosis not present

## 2011-03-23 DIAGNOSIS — E876 Hypokalemia: Secondary | ICD-10-CM | POA: Diagnosis present

## 2011-03-23 DIAGNOSIS — T7840XA Allergy, unspecified, initial encounter: Secondary | ICD-10-CM | POA: Diagnosis not present

## 2011-03-23 DIAGNOSIS — D649 Anemia, unspecified: Secondary | ICD-10-CM | POA: Diagnosis present

## 2011-03-23 DIAGNOSIS — Z79899 Other long term (current) drug therapy: Secondary | ICD-10-CM

## 2011-03-23 HISTORY — DX: Renal tubulo-interstitial disease, unspecified: N15.9

## 2011-03-23 HISTORY — DX: Allergy, unspecified, initial encounter: T78.40XA

## 2011-03-23 LAB — URINALYSIS, ROUTINE W REFLEX MICROSCOPIC
Glucose, UA: NEGATIVE mg/dL
Protein, ur: 100 mg/dL — AB
Specific Gravity, Urine: >1.03 — ABNORMAL HIGH (ref 1.005–1.030)
Urobilinogen, UA: 0.2 mg/dL (ref 0.0–1.0)

## 2011-03-23 LAB — COMPREHENSIVE METABOLIC PANEL
AST: 23 U/L (ref 0–37)
Albumin: 4.2 g/dL (ref 3.5–5.2)
BUN: 9 mg/dL (ref 6–23)
Calcium: 9.8 mg/dL (ref 8.4–10.5)
Chloride: 87 mEq/L — ABNORMAL LOW (ref 96–112)
Creatinine, Ser: 0.78 mg/dL (ref 0.50–1.10)
Total Bilirubin: 0.9 mg/dL (ref 0.3–1.2)

## 2011-03-23 LAB — DIFFERENTIAL
Basophils Absolute: 0 10*3/uL (ref 0.0–0.1)
Basophils Relative: 0 % (ref 0–1)
Eosinophils Absolute: 0 10*3/uL (ref 0.0–0.7)
Eosinophils Relative: 0 % (ref 0–5)
Monocytes Absolute: 3.4 10*3/uL — ABNORMAL HIGH (ref 0.1–1.0)
Monocytes Relative: 13 % — ABNORMAL HIGH (ref 3–12)
Neutro Abs: 21.6 10*3/uL — ABNORMAL HIGH (ref 1.7–7.7)

## 2011-03-23 LAB — CBC
HCT: 43.2 % (ref 36.0–46.0)
Hemoglobin: 14.8 g/dL (ref 12.0–15.0)
MCH: 31.3 pg (ref 26.0–34.0)
MCHC: 34.3 g/dL (ref 30.0–36.0)
MCV: 91.3 fL (ref 78.0–100.0)
RDW: 13.3 % (ref 11.5–15.5)

## 2011-03-23 LAB — URINE MICROSCOPIC-ADD ON

## 2011-03-23 LAB — POCT PREGNANCY, URINE: Preg Test, Ur: NEGATIVE

## 2011-03-23 LAB — LIPASE, BLOOD: Lipase: 10 U/L — ABNORMAL LOW (ref 11–59)

## 2011-03-23 MED ORDER — SODIUM CHLORIDE 0.9 % IV SOLN
INTRAVENOUS | Status: DC
  Administered 2011-03-23: 21:00:00 via INTRAVENOUS

## 2011-03-23 MED ORDER — SODIUM CHLORIDE 0.9 % IV SOLN
INTRAVENOUS | Status: DC
  Filled 2011-03-23: qty 1000

## 2011-03-23 MED ORDER — SODIUM CHLORIDE 0.9 % IV SOLN: 999.0000 mL | INTRAVENOUS | Status: DC

## 2011-03-23 MED ORDER — ONDANSETRON HCL 4 MG/2ML IJ SOLN
4.0000 mg | Freq: Once | INTRAMUSCULAR | Status: AC
  Administered 2011-03-23: 4 mg via INTRAVENOUS
  Filled 2011-03-23: qty 2

## 2011-03-23 MED ORDER — SODIUM CHLORIDE 0.9 % IV BOLUS (SEPSIS)
1000.0000 mL | Freq: Once | INTRAVENOUS | Status: AC
  Administered 2011-03-23: 1000 mL via INTRAVENOUS

## 2011-03-23 MED ORDER — HYDROMORPHONE HCL PF 1 MG/ML IJ SOLN
1.0000 mg | INTRAMUSCULAR | Status: DC | PRN
  Administered 2011-03-23: 1 mg via INTRAVENOUS
  Filled 2011-03-23: qty 1

## 2011-03-23 MED ORDER — DEXTROSE 5 % IV SOLN
1.0000 g | Freq: Once | INTRAVENOUS | Status: AC
  Filled 2011-03-23: qty 10

## 2011-03-23 MED ORDER — HYDROMORPHONE HCL PF 1 MG/ML IJ SOLN
1.0000 mg | Freq: Once | INTRAMUSCULAR | Status: AC
  Administered 2011-03-23: 1 mg via INTRAVENOUS
  Filled 2011-03-23: qty 1

## 2011-03-23 MED ORDER — DEXTROSE 5 % IV SOLN
INTRAVENOUS | Status: AC
  Administered 2011-03-23: 1 g via INTRAVENOUS
  Filled 2011-03-23: qty 10

## 2011-03-23 MED ORDER — SODIUM CHLORIDE 0.9 % IV SOLN: INTRAVENOUS | Status: DC

## 2011-03-23 NOTE — ED Notes (Signed)
Pt back from x-ray.

## 2011-03-23 NOTE — ED Notes (Signed)
Hospitalist at the bedside 

## 2011-03-23 NOTE — ED Notes (Addendum)
Patient complaining of body aches and fever x 2 days. Started vomiting and diarrhea today. States she is "unable to keep anything down." Also complaining of foul urine odor with back pain. States she is on medication for kidney infection.

## 2011-03-23 NOTE — ED Provider Notes (Signed)
Pt to be admitted for pyelonephritis  Benny Lennert, MD 03/23/11 2315

## 2011-03-23 NOTE — ED Notes (Signed)
Pt c/o vomiting and diarrhea that started today. Pt states "I'm just real sick." pt reports that she had a fever last night. Pt states that she has had cough, chills, and her "bones ache." pt reports that she has been around her niece who is currently hospitalized with the flu.

## 2011-03-23 NOTE — ED Notes (Signed)
MD at bedside. 

## 2011-03-23 NOTE — ED Provider Notes (Signed)
History     CSN: [id number]  Arrival date & time 03/23/11  1901   First MD Initiated Contact with Patient 03/23/11 2037      Chief Complaint  Patient presents with  . Emesis  . Generalized Body Aches  . Fever    (Consider location/radiation/quality/duration/timing/severity/associated sxs/prior treatment) HPI Comments: Patient is a 31 year old woman who is sitting up, rocking. She is holding her right flank. She says her back hurts. She's had fever and chills, and feels a stabbing pain in the back. She says this started 2 days ago. She gives a history of kidney infections, since she is on Macrobid for the rest of her life. She has had prior C-section, kidney stone surgery and cholecystectomy. She's had no treatment for this episode.  Patient is a 31 y.o. female presenting with vomiting and fever. The history is provided by the patient and medical records. No language interpreter was used.  Emesis  This is a new problem. The current episode started 2 days ago. The problem occurs continuously. The problem has been gradually worsening. The maximum temperature recorded prior to her arrival was more than 104 F. Associated symptoms include a fever. Pertinent negatives include no diarrhea.  Fever Primary symptoms of the febrile illness include fever, nausea and vomiting. Primary symptoms do not include diarrhea or dysuria.    Past Medical History  Diagnosis Date  . Sciatica   . Scoliosis   . Kidney stone   . Kidney infection     Past Surgical History  Procedure Date  . Kidney stone surgery   . Cesarean section   . Cholecystectomy     History reviewed. No pertinent family history.  History  Substance Use Topics  . Smoking status: Current Everyday Smoker -- 1.0 packs/day    Types: Cigarettes  . Smokeless tobacco: Not on file  . Alcohol Use: No    OB History    Grav Para Term Preterm Abortions TAB SAB Ect Mult Living                  Review of Systems  Constitutional:  Positive for fever.       Temp to 104 last night.  HENT: Negative.   Eyes: Negative.   Respiratory: Negative.   Cardiovascular: Negative.   Gastrointestinal: Positive for nausea and vomiting. Negative for diarrhea.  Genitourinary: Positive for flank pain. Negative for dysuria.       Her last period finished 6 days ago, and was a normal period  Musculoskeletal: Positive for back pain.  Neurological: Negative.   Psychiatric/Behavioral: Negative.     Allergies  Codeine; Nubain; Sulfa antibiotics; and Toradol  Home Medications   Current Outpatient Rx  Name Route Sig Dispense Refill  . NITROFURANTOIN MACROCRYSTAL 100 MG PO CAPS Oral Take 100 mg by mouth daily.      . OXYCODONE-ACETAMINOPHEN 10-650 MG PO TABS Oral Take 1 tablet by mouth every 6 (six) hours as needed. pain      BP 141/76  Pulse 120  Temp(Src) 99.4 F (37.4 C) (Oral)  Resp 20  Ht 5\' 3"  (1.6 m)  Wt 120 lb (54.432 kg)  BMI 21.26 kg/m2  SpO2 100%  LMP 03/16/2011  Physical Exam  Nursing note and vitals reviewed. Constitutional: She is oriented to person, place, and time. She appears well-developed. She appears distressed.       She has a resting tachycardia of approximately 120.  HENT:  Head: Normocephalic and atraumatic.  Right Ear: External ear normal.  Mouth/Throat: Oropharynx is clear and moist.  Eyes: Conjunctivae and EOM are normal. Pupils are equal, round, and reactive to light.  Neck: Normal range of motion. Neck supple.  Cardiovascular: Regular rhythm and normal heart sounds.        Tachycardia of approximately 120.  Pulmonary/Chest: Effort normal and breath sounds normal.  Abdominal: Soft. Bowel sounds are normal.  Musculoskeletal:       She localizes the pain to the right CVA region. There is moderate tenderness in this location.  Neurological: She is alert and oriented to person, place, and time.       No sensory or motor deficit.  Skin: Skin is warm. She is diaphoretic.  Psychiatric: She has a  normal mood and affect. Her behavior is normal.    ED Course  Procedures (including critical care time)   Labs Reviewed  CBC  DIFFERENTIAL  COMPREHENSIVE METABOLIC PANEL  POCT PREGNANCY, URINE  URINALYSIS, ROUTINE W REFLEX MICROSCOPIC  LIPASE, BLOOD   8:58 PM Patient was seen and had physical examination. IV fluids, IV medications for pain and nausea were ordered. Laboratory tests and x-ray was ordered. Old charts were reviewed. She has had an upper CT x-rays the abdomen, and so plain films were obtained this evening.  9:55 PM Only lab that is back is her CBC, which shows WBC of 16109 with 81% neutrophils. Waiting for UA, chemistries, acute abdominal series.  Suspect pyelonephritis.  Signed out to Dr. Estell Harpin at 10: P.M.    No diagnosis found.      Carleene Cooper III, MD 03/23/11 2156

## 2011-03-24 ENCOUNTER — Inpatient Hospital Stay (HOSPITAL_COMMUNITY): Payer: Medicaid - Out of State

## 2011-03-24 LAB — BASIC METABOLIC PANEL
BUN: 6 mg/dL (ref 6–23)
Calcium: 8.8 mg/dL (ref 8.4–10.5)
GFR calc non Af Amer: >90 mL/min (ref >90)
Glucose, Bld: 107 mg/dL — ABNORMAL HIGH (ref 70–99)
Potassium: 3.6 mEq/L (ref 3.5–5.1)

## 2011-03-24 LAB — CBC
Hemoglobin: 12.5 g/dL (ref 12.0–15.0)
MCH: 31.2 pg (ref 26.0–34.0)
MCHC: 34 g/dL (ref 30.0–36.0)

## 2011-03-24 LAB — RAPID URINE DRUG SCREEN, HOSP PERFORMED: Barbiturates: NOT DETECTED

## 2011-03-24 LAB — APTT: aPTT: 40 seconds — ABNORMAL HIGH (ref 24–37)

## 2011-03-24 MED ORDER — DEXTROSE 5 % IV SOLN
INTRAVENOUS | Status: AC
  Filled 2011-03-24: qty 1

## 2011-03-24 MED ORDER — DOCUSATE SODIUM 100 MG PO CAPS
100.0000 mg | ORAL_CAPSULE | Freq: Two times a day (BID) | ORAL | Status: DC
  Administered 2011-03-24 – 2011-03-27 (×6): 100 mg via ORAL
  Filled 2011-03-24 (×7): qty 1

## 2011-03-24 MED ORDER — NICOTINE 21 MG/24HR TD PT24
21.0000 mg | MEDICATED_PATCH | Freq: Every day | TRANSDERMAL | Status: DC
  Administered 2011-03-24: 21 mg via TRANSDERMAL
  Filled 2011-03-24 (×4): qty 1

## 2011-03-24 MED ORDER — ACETAMINOPHEN 650 MG RE SUPP: 650.0000 mg | Freq: Four times a day (QID) | RECTAL | Status: DC | PRN

## 2011-03-24 MED ORDER — TRAZODONE HCL 50 MG PO TABS: 25.0000 mg | ORAL_TABLET | Freq: Every evening | ORAL | Status: DC | PRN

## 2011-03-24 MED ORDER — ONDANSETRON HCL 4 MG PO TABS: 4.0000 mg | ORAL_TABLET | ORAL | Status: DC | PRN

## 2011-03-24 MED ORDER — HYDROMORPHONE HCL PF 1 MG/ML IJ SOLN
1.0000 mg | INTRAMUSCULAR | Status: DC | PRN
  Administered 2011-03-24 – 2011-03-27 (×22): 1 mg via INTRAVENOUS
  Filled 2011-03-24 (×22): qty 1

## 2011-03-24 MED ORDER — ENOXAPARIN SODIUM 40 MG/0.4ML ~~LOC~~ SOLN
40.0000 mg | Freq: Every day | SUBCUTANEOUS | Status: DC
Start: 2011-03-24 — End: 2011-03-27
  Administered 2011-03-24 – 2011-03-27 (×4): 40 mg via SUBCUTANEOUS
  Filled 2011-03-24 (×4): qty 0.4

## 2011-03-24 MED ORDER — HYDROMORPHONE HCL PF 1 MG/ML IJ SOLN
0.5000 mg | INTRAMUSCULAR | Status: DC | PRN
  Administered 2011-03-24 (×2): 0.5 mg via INTRAVENOUS
  Administered 2011-03-24: 02:00:00 via INTRAVENOUS
  Filled 2011-03-24 (×3): qty 1

## 2011-03-24 MED ORDER — BISACODYL 10 MG RE SUPP: 10.0000 mg | Freq: Every day | RECTAL | Status: DC | PRN

## 2011-03-24 MED ORDER — HYDROMORPHONE HCL PF 1 MG/ML IJ SOLN
1.0000 mg | INTRAMUSCULAR | Status: DC | PRN
  Administered 2011-03-24: 1 mg via INTRAVENOUS
  Filled 2011-03-24: qty 1

## 2011-03-24 MED ORDER — SODIUM CHLORIDE 0.9 % IV SOLN
INTRAVENOUS | Status: AC
  Administered 2011-03-24: 02:00:00 via INTRAVENOUS
  Filled 2011-03-24: qty 1000

## 2011-03-24 MED ORDER — ONDANSETRON HCL 4 MG/2ML IJ SOLN
4.0000 mg | Freq: Four times a day (QID) | INTRAMUSCULAR | Status: DC | PRN
  Administered 2011-03-25 (×2): 4 mg via INTRAVENOUS
  Filled 2011-03-24 (×3): qty 2

## 2011-03-24 MED ORDER — POTASSIUM CHLORIDE IN NACL 40-0.9 MEQ/L-% IV SOLN
INTRAVENOUS | Status: AC
  Filled 2011-03-24: qty 2000

## 2011-03-24 MED ORDER — SODIUM CHLORIDE 0.9 % IV SOLN
INTRAVENOUS | Status: DC
  Administered 2011-03-24 (×2): via INTRAVENOUS
  Filled 2011-03-24 (×6): qty 1000

## 2011-03-24 MED ORDER — DEXTROSE 5 % IV SOLN
1.0000 g | Freq: Two times a day (BID) | INTRAVENOUS | Status: DC
  Administered 2011-03-24: 02:00:00 via INTRAVENOUS
  Administered 2011-03-24 – 2011-03-25 (×3): 1 g via INTRAVENOUS
  Filled 2011-03-24 (×4): qty 1

## 2011-03-24 MED ORDER — FLEET ENEMA 7-19 GM/118ML RE ENEM: 1.0000 | ENEMA | Freq: Once | RECTAL | Status: AC | PRN

## 2011-03-24 MED ORDER — ACETAMINOPHEN 325 MG PO TABS
650.0000 mg | ORAL_TABLET | ORAL | Status: DC | PRN
  Administered 2011-03-24 – 2011-03-25 (×4): 650 mg via ORAL
  Filled 2011-03-24: qty 2
  Filled 2011-03-24: qty 1
  Filled 2011-03-24 (×2): qty 2

## 2011-03-24 NOTE — Progress Notes (Signed)
UR Chart Review Completed  

## 2011-03-24 NOTE — Progress Notes (Signed)
Subjective: The patient continues to have right-sided flank pain which is relieved with analgesics. Nausea and vomiting have subsided. She had a soft bowel movement this morning.  Objective: Vital signs in last 24 hours: Filed Vitals:   03/23/11 1906 03/23/11 2355 03/24/11 0034 03/24/11 0612  BP: 141/76 104/60 106/64 97/65  Pulse: 120 126 122 84  Temp: 99.4 F (37.4 C) 102.8 F (39.3 C) 102.8 F (39.3 C) 98.6 F (37 C)  TempSrc: Oral Oral Oral Oral  Resp: 20 20 18 16   Height:   5\' 3"  (1.6 m)   Weight:   56.8 kg (125 lb 3.5 oz)   SpO2: 100% 98% 96% 99%    Intake/Output Summary (Last 24 hours) at 03/24/11 0907 Last data filed at 03/24/11 0600  Gross per 24 hour  Intake    950 ml  Output      0 ml  Net    950 ml    Weight change:   Physical exam: Lungs: Clear to auscultation bilaterally. Heart: S1, S2, with no murmurs rubs or gallops. Abdomen: Positive bowel sounds, soft, mildly tender in the right flank area. No distention, no rigidity, no masses palpated. Extremities: No pedal edema.  Lab Results: Basic Metabolic Panel:  Basename 03/24/11 0455 03/23/11 2106  NA 130* 125*  K 3.6 3.1*  CL 97 87*  CO2 23 23  GLUCOSE 107* 106*  BUN 6 9  CREATININE 0.62 0.78  CALCIUM 8.8 9.8  MG -- --  PHOS -- --   Liver Function Tests:  [facility] 03/23/11 2106  AST 23  ALT 33  ALKPHOS 95  BILITOT 0.9  PROT 8.5*  ALBUMIN 4.2    Basename 03/23/11 2106  LIPASE 10*  AMYLASE --   No results found for this basename: AMMONIA:2 in the last 72 hours CBC:  Basename 03/24/11 0455 03/23/11 2106  WBC 18.7* 26.7*  NEUTROABS -- 21.6*  HGB 12.5 14.8  HCT 36.8 43.2  MCV 91.8 91.3  PLT 229 263   Cardiac Enzymes: No results found for this basename: CKTOTAL:3,CKMB:3,CKMBINDEX:3,TROPONINI:3 in the last 72 hours BNP: No results found for this basename: PROBNP:3 in the last 72 hours D-Dimer: No results found for this basename: DDIMER:2 in the last 72 hours CBG: No results found  for this basename: GLUCAP:6 in the last 72 hours Hemoglobin A1C: No results found for this basename: HGBA1C in the last 72 hours Fasting Lipid Panel: No results found for this basename: CHOL,HDL,LDLCALC,TRIG,CHOLHDL,LDLDIRECT in the last 72 hours Thyroid Function Tests: No results found for this basename: TSH,T4TOTAL,FREET4,T3FREE,THYROIDAB in the last 72 hours Anemia Panel: No results found for this basename: VITAMINB12,FOLATE,FERRITIN,TIBC,IRON,RETICCTPCT in the last 72 hours Coagulation:  Basename 03/24/11 0455  LABPROT 15.9*  INR 1.24   Urine Drug Screen: Drugs of Abuse     Component Value Date/Time   LABOPIA NONE DETECTED 03/24/2011 0020   COCAINSCRNUR NONE DETECTED 03/24/2011 0020   LABBENZ NONE DETECTED 03/24/2011 0020   AMPHETMU NONE DETECTED 03/24/2011 0020   THCU NONE DETECTED 03/24/2011 0020   LABBARB NONE DETECTED 03/24/2011 0020    Alcohol Level: No results found for this basename: ETH:2 in the last 72 hours Urinalysis:  Misc. Labs:  Micro: No results found for this or any previous visit (from the past 240 hour(s)).  Studies/Results: Dg Abd Acute W/chest  03/23/2011  *RADIOLOGY REPORT*  Clinical Data: Right flank pain.  ACUTE ABDOMEN SERIES (ABDOMEN 2 VIEW & CHEST 1 VIEW)  Comparison: None.  Findings: Normal heart size and pulmonary vascularity.  Increased density over the mid lungs is probably due to breast attenuation. No focal airspace consolidation.  No blunting of costophrenic angles.  No pneumothorax.  Scattered gas and stool in the colon.  No small or large bowel distension.  No free intra-abdominal air.  No abnormal air fluid levels.  Surgical clips in the right upper quadrant.  Metallic structure in the mid abdomen consistent with body piercing.  No radiopaque stones.  Visualized bones appear intact.  IMPRESSION: No evidence of active pulmonary disease.  Nonobstructive bowel gas pattern.  Original Report Authenticated By: Marlon Pel, M.D.     Medications: I have reviewed the patient's current medications.  Assessment: Principal Problem:  *Pyelonephritis Active Problems:  Dehydration  Hypokalemia  Hyponatremia  Narcotic dependence, in remission  Tobacco abuse  1. Acute pyelonephritis. She received 1 g of Rocephin in the emergency department. She is now being treated with cefepime. Her white blood cell count is still elevated but improving.  Hyponatremia/dehydration. Improving on IV fluid hydration.  Hypokalemia. Repleted and in the IV fluids.  Tobacco abuse. She was advised to quit. A nicotine patch has been ordered.  Plan:  Continue current management. We'll start a full liquid diet. We'll check the results of the urine culture pending. We'll check the results of the renal ultrasound pending. Tobacco cessation counseling.   LOS: 1 day   Dhyan Noah 03/24/2011, 9:07 AM

## 2011-03-24 NOTE — H&P (Signed)
PCP:  Dr. Vick Frees, [place], [place]  Urologist Dr. Kendall Flack  Chief Complaint:  Fever and right flank pain for 2 days  HPI: Karla Oconnor is an 31 y.o. Caucasian female.    Status post cholecystectomy , History of kidney stones,  recurrent UTIs on chronic nitrofurantoin, remote history of narcotic addiction, presents with sharp right CVA pain, bilious vomiting, and fever for the past 2 days. Patient says her temperature was up to 104 last night; she has noted no gross blood in the urine and no dysuria. Pain does not radiate and there been no relieving factors.  Initially patient came to our emergency room, because she says she doesn't have confidence in her local hospital, and a urinalysis was done which suggests dehydration and urinary tract infection on the hospitalist service was called to assist with management.  Rewiew of Systems:  The patient denies,  weight loss,, vision loss, decreased hearing, hoarseness, chest pain, syncope, dyspnea on exertion, peripheral edema, balance deficits, hemoptysis, abdominal pain, melena, hematochezia, severe indigestion/heartburn, hematuria, incontinence, genital sores, muscle weakness, suspicious skin lesions, transient blindness, difficulty walking, depression, unusual weight change, abnormal bleeding, enlarged lymph nodes, angioedema, and breast masses.    Past Medical History  Diagnosis Date  . Sciatica   . Scoliosis   . Kidney stone   . Kidney infection     Past Surgical History  Procedure Date  . Kidney stone surgery   . Cesarean section   . Cholecystectomy     Medications:  HOME MEDS: Prior to Admission medications   Medication Sig Start Date End Date Taking? Authorizing Provider  nitrofurantoin (MACRODANTIN) 100 MG capsule Take 100 mg by mouth daily.     Yes Historical Provider, MD  oxyCODONE-acetaminophen (PERCOCET) 10-650 MG per tablet Take 1 tablet by mouth every 6 (six) hours as needed. pain   Yes Historical Provider, MD      Allergies:  Allergies  Allergen Reactions  . Codeine Hives  . Nubain (Nalbuphine Hcl)   . Sulfa Antibiotics   . Toradol     Social History:   reports that she has been smoking Cigarettes.  She has been smoking about 1 pack per day. She does not have any smokeless tobacco history on file. She reports that she does not drink alcohol or use illicit drugs.  Family History: History reviewed. No pertinent family history.   Physical Exam: Filed Vitals:   03/23/11 1904 03/23/11 1906 03/23/11 2355  BP:  141/76 104/60  Pulse:  120 126  Temp:  99.4 F (37.4 C) 102.8 F (39.3 C)  TempSrc:  Oral Oral  Resp:  20 20  Height: 5\' 3"  (1.6 m)    Weight: 54.432 kg (120 lb)    SpO2:  100% 98%   Blood pressure 104/60, pulse 126, temperature 102.8 F (39.3 C), temperature source Oral, resp. rate 20, height 5\' 3"  (1.6 m), weight 54.432 kg (120 lb), last menstrual period 03/16/2011, SpO2 98.00%.  GEN:  drowsy ill-looking young Caucasian lady  lying in the stretcher in  painful distress; cooperative with exam PSYCH:  alert and oriented x4;  affect is  appropriate  HEENT: Mucous membranes pink, dry,  and anicteric; PERRLA; EOM intact; no cervical lymphadenopathy nor thyromegaly or carotid bruit; no JVD; Breasts:: Not examined CHEST WALL: No tenderness CHEST: Normal respiration, clear to auscultation bilaterally HEART: Regular rate and rhythm;  3/6 systolic murmur, S4 gallop BACK: No kyphosis or scoliosis;  marked right  CVA tenderness ABDOMEN: Scaphoid  soft  non-tender; no masses, no organomegaly, normal abdominal bowel sounds; no pannus;  Rectal Exam: Not done EXTREMITIES: No bone or joint deformity; no edema; no ulcerations. Genitalia: not examined PULSES: 2+ and symmetric SKIN: Normal hydration no rash or ulceration CNS: Cranial nerves 2-12 grossly intact no focal neurologic deficit   Labs & Imaging Results for orders placed during the hospital encounter of 03/23/11 (from the past  48 hour(s))  CBC     Status: Abnormal   Collection Time   03/23/11  9:06 PM      Component Value Range Comment   WBC 26.7 (*) 4.0 - 10.5 (K/uL)    RBC 4.73  3.87 - 5.11 (MIL/uL)    Hemoglobin 14.8  12.0 - 15.0 (g/dL)    HCT 16.1  09.6 - 04.5 (%)    MCV 91.3  78.0 - 100.0 (fL)    MCH 31.3  26.0 - 34.0 (pg)    MCHC 34.3  30.0 - 36.0 (g/dL)    RDW 40.9  81.1 - 91.4 (%)    Platelets 263  150 - 400 (K/uL)   DIFFERENTIAL     Status: Abnormal   Collection Time   03/23/11  9:06 PM      Component Value Range Comment   Neutrophils Relative 81 (*) 43 - 77 (%)    Neutro Abs 21.6 (*) 1.7 - 7.7 (K/uL)    Lymphocytes Relative 6 (*) 12 - 46 (%)    Lymphs Abs 1.6  0.7 - 4.0 (K/uL)    Monocytes Relative 13 (*) 3 - 12 (%)    Monocytes Absolute 3.4 (*) 0.1 - 1.0 (K/uL)    Eosinophils Relative 0  0 - 5 (%)    Eosinophils Absolute 0.0  0.0 - 0.7 (K/uL)    Basophils Relative 0  0 - 1 (%)    Basophils Absolute 0.0  0.0 - 0.1 (K/uL)   COMPREHENSIVE METABOLIC PANEL     Status: Abnormal   Collection Time   03/23/11  9:06 PM      Component Value Range Comment   Sodium 125 (*) 135 - 145 (mEq/L)    Potassium 3.1 (*) 3.5 - 5.1 (mEq/L)    Chloride 87 (*) 96 - 112 (mEq/L)    CO2 23  19 - 32 (mEq/L)    Glucose, Bld 106 (*) 70 - 99 (mg/dL)    BUN 9  6 - 23 (mg/dL)    Creatinine, Ser 7.82  0.50 - 1.10 (mg/dL)    Calcium 9.8  8.4 - 10.5 (mg/dL)    Total Protein 8.5 (*) 6.0 - 8.3 (g/dL)    Albumin 4.2  3.5 - 5.2 (g/dL)    AST 23  0 - 37 (U/L)    ALT 33  0 - 35 (U/L)    Alkaline Phosphatase 95  39 - 117 (U/L)    Total Bilirubin 0.9  0.3 - 1.2 (mg/dL)    GFR calc non Af Amer >90  >90 (mL/min)    GFR calc Af Amer >90  >90 (mL/min)   LIPASE, BLOOD     Status: Abnormal   Collection Time   03/23/11  9:06 PM      Component Value Range Comment   Lipase 10 (*) 11 - 59 (U/L)   URINALYSIS, ROUTINE W REFLEX MICROSCOPIC     Status: Abnormal   Collection Time   03/23/11 10:02 PM      Component Value Range Comment    Color, Urine YELLOW  YELLOW  APPearance CLOUDY (*) CLEAR     Specific Gravity, Urine >1.030 (*) 1.005 - 1.030     pH 6.0  5.0 - 8.0     Glucose, UA NEGATIVE  NEGATIVE (mg/dL)    Hgb urine dipstick SMALL (*) NEGATIVE     Bilirubin Urine NEGATIVE  NEGATIVE     Ketones, ur 40 (*) NEGATIVE (mg/dL)    Protein, ur 161 (*) NEGATIVE (mg/dL)    Urobilinogen, UA 0.2  0.0 - 1.0 (mg/dL)    Nitrite POSITIVE (*) NEGATIVE     Leukocytes, UA TRACE (*) NEGATIVE    URINE MICROSCOPIC-ADD ON     Status: Abnormal   Collection Time   03/23/11 10:02 PM      Component Value Range Comment   Squamous Epithelial / LPF FEW (*) RARE     WBC, UA 21-50  <3 (WBC/hpf)    RBC / HPF 11-20  <3 (RBC/hpf)    Bacteria, UA MANY (*) RARE    POCT PREGNANCY, URINE     Status: Normal   Collection Time   03/23/11 10:03 PM      Component Value Range Comment   Preg Test, Ur NEGATIVE  NEGATIVE     Dg Abd Acute W/chest  03/23/2011  *RADIOLOGY REPORT*  Clinical Data: Right flank pain.  ACUTE ABDOMEN SERIES (ABDOMEN 2 VIEW & CHEST 1 VIEW)  Comparison: None.  Findings: Normal heart size and pulmonary vascularity.  Increased density over the mid lungs is probably due to breast attenuation. No focal airspace consolidation.  No blunting of costophrenic angles.  No pneumothorax.  Scattered gas and stool in the colon.  No small or large bowel distension.  No free intra-abdominal air.  No abnormal air fluid levels.  Surgical clips in the right upper quadrant.  Metallic structure in the mid abdomen consistent with body piercing.  No radiopaque stones.  Visualized bones appear intact.  IMPRESSION: No evidence of active pulmonary disease.  Nonobstructive bowel gas pattern.  Original Report Authenticated By: Marlon Pel, M.D.      Assessment Present on Admission:  .Pyelonephritis .Dehydration .Hypokalemia .Hyponatremia .Narcotic dependence, in remission .Tobacco abuse   PLAN: Admit this lady for treatment of presumed  pyelonephritis; Maxipime pending cultures; Vigorous hydration and potassium repletion. Nicotine counseling and nicotine replacement ultrasound of the urinary tract later tomorrow  Other plans as per orders.  other plans to do depending on results of preliminary investigations   Niguel Moure 03/24/2011, 12:07 AM

## 2011-03-25 LAB — CBC
MCH: 31 pg (ref 26.0–34.0)
MCHC: 33.5 g/dL (ref 30.0–36.0)
MCV: 92.5 fL (ref 78.0–100.0)
Platelets: 195 10*3/uL (ref 150–400)
RDW: 13.4 % (ref 11.5–15.5)
WBC: 12.1 10*3/uL — ABNORMAL HIGH (ref 4.0–10.5)

## 2011-03-25 LAB — BASIC METABOLIC PANEL
BUN: <3 mg/dL — ABNORMAL LOW (ref 6–23)
CO2: 27 mEq/L (ref 19–32)
Calcium: 9.3 mg/dL (ref 8.4–10.5)
Creatinine, Ser: 0.51 mg/dL (ref 0.50–1.10)

## 2011-03-25 MED ORDER — INFLUENZA VIRUS VACC SPLIT PF IM SUSP
0.5000 mL | INTRAMUSCULAR | Status: AC
  Administered 2011-03-26: 0.5 mL via INTRAMUSCULAR
  Filled 2011-03-25: qty 0.5

## 2011-03-25 MED ORDER — DEXTROSE 5 % IV SOLN
INTRAVENOUS | Status: AC
  Filled 2011-03-25: qty 10

## 2011-03-25 MED ORDER — DEXTROSE 5 % IV SOLN
1.0000 g | INTRAVENOUS | Status: DC
  Administered 2011-03-26 – 2011-03-27 (×2): 1 g via INTRAVENOUS
  Filled 2011-03-25 (×4): qty 10

## 2011-03-25 MED ORDER — PANTOPRAZOLE SODIUM 40 MG IV SOLR
40.0000 mg | INTRAVENOUS | Status: DC
  Administered 2011-03-25 – 2011-03-27 (×3): 40 mg via INTRAVENOUS
  Filled 2011-03-25 (×3): qty 40

## 2011-03-25 MED ORDER — KCL IN DEXTROSE-NACL 20-5-0.9 MEQ/L-%-% IV SOLN
INTRAVENOUS | Status: DC
  Administered 2011-03-25: 1000 mL via INTRAVENOUS
  Administered 2011-03-26: 06:00:00 via INTRAVENOUS
  Administered 2011-03-27: 1000 mL via INTRAVENOUS

## 2011-03-25 MED ORDER — CIPROFLOXACIN IN D5W 400 MG/200ML IV SOLN
400.0000 mg | Freq: Two times a day (BID) | INTRAVENOUS | Status: DC
  Administered 2011-03-25 (×2): 400 mg via INTRAVENOUS
  Filled 2011-03-25 (×3): qty 200

## 2011-03-25 NOTE — Progress Notes (Signed)
Subjective:  The patient has less flank pain but she continues to have nausea. The nausea coincides with fever spikes. She did have one episode of vomiting this morning.  Objective: Vital signs in last 24 hours: Filed Vitals:   03/24/11 2115 03/25/11 0300 03/25/11 0553 03/25/11 0959  BP: 97/63  107/67 96/64  Pulse: 100  68 88  Temp: 98.7 F (37.1 C) 102.3 F (39.1 C) 98.5 F (36.9 C) 98 F (36.7 C)  TempSrc: Oral Oral Oral Oral  Resp: 16  20 20   Height:      Weight:   59.1 kg (130 lb 4.7 oz)   SpO2: 95%  95% 96%    Intake/Output Summary (Last 24 hours) at 03/25/11 1318 Last data filed at 03/25/11 0600  Gross per 24 hour  Intake   1300 ml  Output      0 ml  Net   1300 ml    Weight change: 4.668 kg (10 lb 4.7 oz)  Physical exam: Lungs: Clear to auscultation bilaterally. Heart: S1, S2, with no murmurs rubs or gallops. Abdomen: Positive bowel sounds, soft, mildly tender in the right flank area. No distention, no rigidity, no masses palpated. Extremities: No pedal edema.  Lab Results: Basic Metabolic Panel:  Basename 03/25/11 0609 03/24/11 0455  NA 137 130*  K 4.4 3.6  CL 105 97  CO2 27 23  GLUCOSE 112* 107*  BUN <3* 6  CREATININE 0.51 0.62  CALCIUM 9.3 8.8  MG -- --  PHOS -- --   Liver Function Tests:  [facility] 03/23/11 2106  AST 23  ALT 33  ALKPHOS 95  BILITOT 0.9  PROT 8.5*  ALBUMIN 4.2    Basename 03/23/11 2106  LIPASE 10*  AMYLASE --   No results found for this basename: AMMONIA:2 in the last 72 hours CBC:  Basename 03/25/11 0609 03/24/11 0455 03/23/11 2106  WBC 12.1* 18.7* --  NEUTROABS -- -- 21.6*  HGB 11.2* 12.5 --  HCT 33.4* 36.8 --  MCV 92.5 91.8 --  PLT 195 229 --   Cardiac Enzymes: No results found for this basename: CKTOTAL:3,CKMB:3,CKMBINDEX:3,TROPONINI:3 in the last 72 hours BNP: No results found for this basename: PROBNP:3 in the last 72 hours D-Dimer: No results found for this basename: DDIMER:2 in the last 72  hours CBG: No results found for this basename: GLUCAP:6 in the last 72 hours Hemoglobin A1C: No results found for this basename: HGBA1C in the last 72 hours Fasting Lipid Panel: No results found for this basename: CHOL,HDL,LDLCALC,TRIG,CHOLHDL,LDLDIRECT in the last 72 hours Thyroid Function Tests: No results found for this basename: TSH,T4TOTAL,FREET4,T3FREE,THYROIDAB in the last 72 hours Anemia Panel: No results found for this basename: VITAMINB12,FOLATE,FERRITIN,TIBC,IRON,RETICCTPCT in the last 72 hours Coagulation:  Basename 03/24/11 0455  LABPROT 15.9*  INR 1.24   Urine Drug Screen: Drugs of Abuse     Component Value Date/Time   LABOPIA NONE DETECTED 03/24/2011 0020   COCAINSCRNUR NONE DETECTED 03/24/2011 0020   LABBENZ NONE DETECTED 03/24/2011 0020   AMPHETMU NONE DETECTED 03/24/2011 0020   THCU NONE DETECTED 03/24/2011 0020   LABBARB NONE DETECTED 03/24/2011 0020    Alcohol Level: No results found for this basename: ETH:2 in the last 72 hours Urinalysis:  Misc. Labs:  Micro: Recent Results (from the past 240 hour(s))  URINE CULTURE     Status: Normal (Preliminary result)   Collection Time   03/24/11 12:20 AM      Component Value Range Status Comment   Specimen Description URINE, CLEAN  CATCH   Final    Special Requests NONE   Final    Culture  Setup Time [id number]   Final    Colony Count >=100,000 COLONIES/ML   Final    Culture ESCHERICHIA COLI   Final    Report Status PENDING   Incomplete     Studies/Results: US Renal  03/24/2011  *RADIOLOGY REPORT*  Clinical Data: Right flank and back pain.  History of kidney stones.  RENAL/URINARY TRACT ULTRASOUND COMPLETE  Comparison:  CT dated 01/22/2011.  Findings:  Right Kidney:  Normal, measuring 11.8 cm in length.  No stones or hydronephrosis seen.  Left Kidney:  Normal, measuring 11.3 cm in length.  No stones or hydronephrosis.  A tiny echogenic focus in the upper pole is compatible with a vessel, with no stone seen at  that location on the recent CT.  Bladder:  Normal.  IMPRESSION: Normal examination.  Original Report Authenticated By: Darrol Angel, M.D.   Dg Abd Acute W/chest  03/23/2011  *RADIOLOGY REPORT*  Clinical Data: Right flank pain.  ACUTE ABDOMEN SERIES (ABDOMEN 2 VIEW & CHEST 1 VIEW)  Comparison: None.  Findings: Normal heart size and pulmonary vascularity.  Increased density over the mid lungs is probably due to breast attenuation. No focal airspace consolidation.  No blunting of costophrenic angles.  No pneumothorax.  Scattered gas and stool in the colon.  No small or large bowel distension.  No free intra-abdominal air.  No abnormal air fluid levels.  Surgical clips in the right upper quadrant.  Metallic structure in the mid abdomen consistent with body piercing.  No radiopaque stones.  Visualized bones appear intact.  IMPRESSION: No evidence of active pulmonary disease.  Nonobstructive bowel gas pattern.  Original Report Authenticated By: Marlon Pel, M.D.    Medications: I have reviewed the patient's current medications.  Assessment: Principal Problem:  *Pyelonephritis Active Problems:  Dehydration  Hypokalemia  Hyponatremia  Narcotic dependence, in remission  Tobacco abuse  1. Acute pyelonephritis. She received 1 g of Rocephin in the emergency department. She is now being treated with cefepime. Her white blood cell count is still elevated but improving. She continues to spike fevers on cefepime. Her urine culture is growing out Escherichia coli. Sensitivities are pending. Given that her fevers have not resolved, will change antibiotic from cefepime to Cipro. Will await the results of the urine culture. Of note, her renal ultrasound was unremarkable.  Hyponatremia/dehydration. Improving on IV fluid hydration.  Hypokalemia. Repleted and in the IV fluids.  Tobacco abuse. She was advised to quit. A nicotine patch has been ordered.  Plan:  Discontinue cefepime in favor of IV Cipro.  Continue supportive care and IV fluid hydration.   LOS: 2 days   Marietta Sikkema 03/25/2011, 1:18 PM

## 2011-03-26 ENCOUNTER — Encounter (HOSPITAL_COMMUNITY): Payer: Self-pay | Admitting: Internal Medicine

## 2011-03-26 DIAGNOSIS — T7840XA Allergy, unspecified, initial encounter: Secondary | ICD-10-CM

## 2011-03-26 HISTORY — DX: Allergy, unspecified, initial encounter: T78.40XA

## 2011-03-26 LAB — URINE CULTURE: Culture  Setup Time: 201301280055

## 2011-03-26 MED ORDER — CLOTRIMAZOLE 2 % VA CREA
1.0000 | TOPICAL_CREAM | Freq: Every day | VAGINAL | Status: DC
  Filled 2011-03-26: qty 22.2

## 2011-03-26 MED ORDER — MICONAZOLE NITRATE 2 % VA CREA
1.0000 | TOPICAL_CREAM | Freq: Every day | VAGINAL | Status: DC
  Administered 2011-03-26: 1 via VAGINAL
  Filled 2011-03-26: qty 45

## 2011-03-26 NOTE — Progress Notes (Signed)
Subjective:  Patient has had no nausea and vomiting and wants to eat. Apparently, she had redness and swelling around the IV site yesterday during the Cipro infusion. She reported no prior history of Cipro allergy.  Objective: Vital signs in last 24 hours: Filed Vitals:   03/25/11 2157 03/26/11 0500 03/26/11 0544 03/26/11 1204  BP: 132/75  103/66   Pulse: 83  62 75  Temp: 98.2 F (36.8 C)  98.2 F (36.8 C)   TempSrc:      Resp: 20  20   Height:      Weight:  58.6 kg (129 lb 3 oz)    SpO2: 100%  98% 98%    Intake/Output Summary (Last 24 hours) at 03/26/11 1313 Last data filed at 03/26/11 0604  Gross per 24 hour  Intake 4560.25 ml  Output    100 ml  Net 4460.25 ml    Weight change: -0.5 kg (-1 lb 1.6 oz)  Physical exam: Lungs: Clear to auscultation bilaterally. Heart: S1, S2, with no murmurs rubs or gallops. Abdomen: Positive bowel sounds, soft, mildly tender in the right flank area. No distention, no rigidity, no masses palpated. Extremities: No pedal edema. Mild edema over right hand, former IV site, mildly tender. Radial pulse palpable to  Lab Results: Basic Metabolic Panel:  [facility] 03/25/11 0609 03/24/11 0455  NA 137 130*  K 4.4 3.6  CL 105 97  CO2 27 23  GLUCOSE 112* 107*  BUN <3* 6  CREATININE 0.51 0.62  CALCIUM 9.3 8.8  MG -- --  PHOS -- --   Liver Function Tests:  [facility] 03/23/11 2106  AST 23  ALT 33  ALKPHOS 95  BILITOT 0.9  PROT 8.5*  ALBUMIN 4.2    Basename 03/23/11 2106  LIPASE 10*  AMYLASE --   No results found for this basename: AMMONIA:2 in the last 72 hours CBC:  Basename 03/25/11 0609 03/24/11 0455 03/23/11 2106  WBC 12.1* 18.7* --  NEUTROABS -- -- 21.6*  HGB 11.2* 12.5 --  HCT 33.4* 36.8 --  MCV 92.5 91.8 --  PLT 195 229 --   Cardiac Enzymes: No results found for this basename: CKTOTAL:3,CKMB:3,CKMBINDEX:3,TROPONINI:3 in the last 72 hours BNP: No results found for this basename: PROBNP:3 in the last 72  hours D-Dimer: No results found for this basename: DDIMER:2 in the last 72 hours CBG: No results found for this basename: GLUCAP:6 in the last 72 hours Hemoglobin A1C: No results found for this basename: HGBA1C in the last 72 hours Fasting Lipid Panel: No results found for this basename: CHOL,HDL,LDLCALC,TRIG,CHOLHDL,LDLDIRECT in the last 72 hours Thyroid Function Tests: No results found for this basename: TSH,T4TOTAL,FREET4,T3FREE,THYROIDAB in the last 72 hours Anemia Panel: No results found for this basename: VITAMINB12,FOLATE,FERRITIN,TIBC,IRON,RETICCTPCT in the last 72 hours Coagulation:  Basename 03/24/11 0455  LABPROT 15.9*  INR 1.24   Urine Drug Screen: Drugs of Abuse     Component Value Date/Time   LABOPIA NONE DETECTED 03/24/2011 0020   COCAINSCRNUR NONE DETECTED 03/24/2011 0020   LABBENZ NONE DETECTED 03/24/2011 0020   AMPHETMU NONE DETECTED 03/24/2011 0020   THCU NONE DETECTED 03/24/2011 0020   LABBARB NONE DETECTED 03/24/2011 0020    Alcohol Level: No results found for this basename: ETH:2 in the last 72 hours Urinalysis:  Misc. Labs:  Micro: Recent Results (from the past 240 hour(s))  URINE CULTURE     Status: Normal   Collection Time   03/24/11 12:20 AM      Component Value Range Status  Comment   Specimen Description URINE, CLEAN CATCH   Final    Special Requests NONE   Final    Culture  Setup Time [id number]   Final    Colony Count >=100,000 COLONIES/ML   Final    Culture ESCHERICHIA COLI   Final    Report Status 03/26/2011 FINAL   Final    Organism ID, Bacteria ESCHERICHIA COLI   Final     Studies/Results: No results found.  Medications: I have reviewed the patient's current medications.  Assessment: Principal Problem:  *Pyelonephritis Active Problems:  Dehydration  Hypokalemia  Hyponatremia  Narcotic dependence, in remission  Tobacco abuse  Allergic reaction caused by a drug  1. Acute pyelonephritis. She is now afebrile x24 hours.  Apparently she had an allergic reaction to the IV Cipro, although, she is taken oral Cipro in the past. It has been discontinued. Rocephin was started last night. Urine culture is growing out Escherichia coli, sensitive to Rocephin.   Plan:  Continue current management. Will decrease the rate of IV fluids. We'll advance her diet to a regular diet. Plan discharge tomorrow.   LOS: 3 days   Katelynne Revak 03/26/2011, 1:13 PM

## 2011-03-27 DIAGNOSIS — D649 Anemia, unspecified: Secondary | ICD-10-CM | POA: Diagnosis present

## 2011-03-27 DIAGNOSIS — B373 Candidiasis of vulva and vagina: Secondary | ICD-10-CM | POA: Diagnosis not present

## 2011-03-27 LAB — CBC
MCH: 30.6 pg (ref 26.0–34.0)
MCHC: 33.3 g/dL (ref 30.0–36.0)
MCV: 91.9 fL (ref 78.0–100.0)
Platelets: 272 10*3/uL (ref 150–400)
RDW: 13.5 % (ref 11.5–15.5)
WBC: 7.4 10*3/uL (ref 4.0–10.5)

## 2011-03-27 MED ORDER — SODIUM CHLORIDE 0.9 % IJ SOLN
INTRAMUSCULAR | Status: AC
  Administered 2011-03-27: 10:00:00
  Filled 2011-03-27: qty 3

## 2011-03-27 MED ORDER — SODIUM CHLORIDE 0.9 % IJ SOLN
INTRAMUSCULAR | Status: AC
  Administered 2011-03-27: 11:00:00
  Filled 2011-03-27: qty 3

## 2011-03-27 MED ORDER — CEFUROXIME AXETIL 500 MG PO TABS: 500.0000 mg | ORAL_TABLET | Freq: Two times a day (BID) | ORAL | Status: AC

## 2011-03-27 MED ORDER — ONDANSETRON HCL 4 MG PO TABS: 4.0000 mg | ORAL_TABLET | ORAL | Status: AC | PRN

## 2011-03-27 MED ORDER — MICONAZOLE NITRATE 2 % VA CREA: 1.0000 | TOPICAL_CREAM | Freq: Every day | VAGINAL | Status: AC

## 2011-03-27 MED ORDER — OXYCODONE-ACETAMINOPHEN 10-650 MG PO TABS: 1.0000 | ORAL_TABLET | Freq: Four times a day (QID) | ORAL | Status: DC | PRN

## 2011-03-27 NOTE — Progress Notes (Signed)
Discharge summary sent to payer through [facility]  

## 2011-03-27 NOTE — Progress Notes (Signed)
Reviewed discharge instructions, medications, and follow up appointment with patient. Understanding voiced to instruction. Patient discharged to home in stable condition.

## 2011-03-27 NOTE — Discharge Summary (Signed)
Physician Discharge Summary  Karla Oconnor MRN: [id number] DOB/AGE: 1980/10/25 31 y.o.  PCP: Dr. Richardson Dopp in [place].   Admit date: 03/23/2011 Discharge date: 03/27/2011  Discharge Diagnoses:  1. Acute pyelonephritis, secondary to Escherichia coli. 2. 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 3. Chronic recurrent urinary tract infections. 4. Yeast vaginitis, clinical diagnosis. 5. Tobacco abuse. 6. Dehydration. 7. Hyponatremia secondary to dehydration. 8. Hypokalemia. 9. Mild normocytic anemia. 10. Narcotic dependence in remission. 11. Allergic reaction caused by intravenous Cipro.     Medication List  As of 03/27/2011 10:39 AM   STOP taking these medications         nitrofurantoin 100 MG capsule         TAKE these medications         cefUROXime 500 MG tablet   Commonly known as: CEFTIN   Take 1 tablet (500 mg total) by mouth 2 (two) times daily. ANTIBIOTIC.      miconazole 2 % vaginal cream   Commonly known as: MONISTAT 7   Place 7 Applicatorfuls vaginally at bedtime.      ondansetron 4 MG tablet   Commonly known as: ZOFRAN   Take 1 tablet (4 mg total) by mouth every 4 (four) hours as needed.      oxyCODONE-acetaminophen 10-650 MG per tablet   Commonly known as: PERCOCET   Take 1 tablet by mouth every 6 (six) hours as needed. pain            Discharge Condition: Improved and stable.  Disposition: Home or Self Care   Consults: None.   Significant Diagnostic Studies: US Renal  03/24/2011  *RADIOLOGY REPORT*  Clinical Data: Right flank and back pain.  History of kidney stones.  RENAL/URINARY TRACT ULTRASOUND COMPLETE  Comparison:  CT dated 01/22/2011.  Findings:  Right Kidney:  Normal, measuring 11.8 cm in length.  No stones or hydronephrosis seen.  Left Kidney:  Normal, measuring 11.3 cm in length.  No stones or hydronephrosis.  A tiny echogenic focus in the upper pole is compatible with a vessel, with no stone seen at that location on the  recent CT.  Bladder:  Normal.  IMPRESSION: Normal examination.  Original Report Authenticated By: Darrol Angel, M.D.   Dg Abd Acute W/chest  03/23/2011  *RADIOLOGY REPORT*  Clinical Data: Right flank pain.  ACUTE ABDOMEN SERIES (ABDOMEN 2 VIEW & CHEST 1 VIEW)  Comparison: None.  Findings: Normal heart size and pulmonary vascularity.  Increased density over the mid lungs is probably due to breast attenuation. No focal airspace consolidation.  No blunting of costophrenic angles.  No pneumothorax.  Scattered gas and stool in the colon.  No small or large bowel distension.  No free intra-abdominal air.  No abnormal air fluid levels.  Surgical clips in the right upper quadrant.  Metallic structure in the mid abdomen consistent with body piercing.  No radiopaque stones.  Visualized bones appear intact.  IMPRESSION: No evidence of active pulmonary disease.  Nonobstructive bowel gas pattern.  Original Report Authenticated By: Marlon Pel, M.D.     Microbiology: Recent Results (from the past 240 hour(s))  URINE CULTURE     Status: Normal   Collection Time   03/24/11 12:20 AM      Component Value Range Status Comment   Specimen Description URINE, CLEAN CATCH   Final    Special Requests NONE   Final    Culture  Setup Time [id number]   Final    Colony Count >=  100,000 COLONIES/ML   Final    Culture ESCHERICHIA COLI   Final    Report Status 03/26/2011 FINAL   Final    Organism ID, Bacteria ESCHERICHIA COLI   Final      Labs: Results for orders placed during the hospital encounter of 03/23/11 (from the past 48 hour(s))  CBC     Status: Abnormal   Collection Time   03/27/11  5:39 AM      Component Value Range Comment   WBC 7.4  4.0 - 10.5 (K/uL)    RBC 3.59 (*) 3.87 - 5.11 (MIL/uL)    Hemoglobin 11.0 (*) 12.0 - 15.0 (g/dL)    HCT 40.9 (*) 81.1 - 46.0 (%)    MCV 91.9  78.0 - 100.0 (fL)    MCH 30.6  26.0 - 34.0 (pg)    MCHC 33.3  30.0 - 36.0 (g/dL)    RDW 91.4  78.2 - 95.6 (%)    Platelets  272  150 - 400 (K/uL) DELTA CHECK NOTED     HPI : The patient is a 31 year old woman with a past medical history significant for kidney stones, status post cholecystectomy, and recurrent urinary tract infections, who presented to the emergency department on 03/23/2011 with a chief complaint of fever and right-sided flank pain. In the emergency department, she was tachycardic with a heart rate of 126 and febrile with a temperature of 102.8. Her white blood cell count was 26.7, her sodium was 125, and her potassium was 3.1. Her acute abdominal series revealed no evidence of active pulmonary disease and a nonobstructive bowel gas pattern. Her urinalysis was suggestive of infection. She was admitted for further evaluation and management.  HOSPITAL COURSE: The patient was given IV Rocephin in the emergency department. She was subsequently started on cefepime. A urine culture was also ordered in the emergency department. IV fluids were started for hydration. Potassium chloride was added in the IV fluids for repletion. Analgesics and antiemetics were ordered for pain and vomiting respectively. Eventually, IV Protonix was added. Tobacco cessation counseling was ordered. A nicotine patch was placed. She was advised to stop smoking. She continued to spike fevers through cefepime for 48 hours. A decision was made to discontinue it in favor of IV Cipro. However, the patient developed a rash and erythema at the IV site twice, and therefore, it was discontinued. The patient told me that she had taken oral Cipro in the past with no ill effects. Nevertheless, Rocephin was started instead. The ultrasound of her kidneys revealed no acute abnormalities, specifically, no evidence of inflammation, fluid, or stones.  Over the course of the hospitalization, her urine culture grew out greater than 100,000 colonies of Escherichia coli, sensitive to ceftriaxone. Her pain subsided. Her nausea resolved. Her diet was advanced. She  tolerated it well. She became afebrile and remained afebrile through the remainder of the hospitalization. Her white blood cell count normalized. Her serum sodium and serum potassium normalized. She was in no acute distress. She received a total of 4 days of antibiotic treatment in the hospital. She was discharged home on 7 more days of Ceftin.  Of note, the patient reported having some type of urological anatomical abnormality at birth which is why she has recurrent urinary tract infections. She is on chronic suppressive therapy with Macrodantin. This was discontinued during antibiotic therapy during the hospitalization.    Discharge Exam: Blood pressure 105/70, pulse 63, temperature 98.2 F (36.8 C), temperature source Oral, resp. rate 18, height 5'  3" (1.6 m), weight 58.9 kg (129 lb 13.6 oz), last menstrual period 03/16/2011, SpO2 98.00%.  Lungs: Clear to auscultation bilaterally. Heart: S1, S2, with no murmurs rubs or gallops. Abdomen: Mildly tender left flank, positive bowel sounds, nondistended, no masses palpated. Extremities: No pedal edema.    Discharge Orders    Future Orders Please Complete By Expires   Diet - low sodium heart healthy      Diet general      Increase activity slowly      Discharge instructions      Comments:   PLEASE NOTE THAT YOU HAD AN ALLERGIC REACTION TO INTRAVENOUS CIPRO.      Follow-up Information    Please follow up. (Follow up with Dr. Richardson Dopp on April 03, 2011 at 9:00 am.)          Signed: Keliah Harned 03/27/2011, 10:39 AM

## 2011-05-25 ENCOUNTER — Encounter (HOSPITAL_COMMUNITY): Payer: Self-pay | Admitting: *Deleted

## 2011-05-25 ENCOUNTER — Emergency Department (HOSPITAL_COMMUNITY)
Admission: EM | Admit: 2011-05-25 | Discharge: 2011-05-25 | Disposition: A | Discharge status: Home / Self Care | Payer: Self-pay | Attending: Emergency Medicine | Admitting: Emergency Medicine

## 2011-05-25 DIAGNOSIS — R000 Tachycardia, unspecified: Secondary | ICD-10-CM | POA: Insufficient documentation

## 2011-05-25 DIAGNOSIS — F17200 Nicotine dependence, unspecified, uncomplicated: Secondary | ICD-10-CM | POA: Insufficient documentation

## 2011-05-25 DIAGNOSIS — R109 Unspecified abdominal pain: Secondary | ICD-10-CM | POA: Insufficient documentation

## 2011-05-25 DIAGNOSIS — Z9089 Acquired absence of other organs: Secondary | ICD-10-CM | POA: Insufficient documentation

## 2011-05-25 DIAGNOSIS — R10819 Abdominal tenderness, unspecified site: Secondary | ICD-10-CM | POA: Insufficient documentation

## 2011-05-25 DIAGNOSIS — N12 Tubulo-interstitial nephritis, not specified as acute or chronic: Secondary | ICD-10-CM | POA: Insufficient documentation

## 2011-05-25 LAB — CBC
HCT: 39.3 % (ref 36.0–46.0)
MCH: 30.5 pg (ref 26.0–34.0)
MCV: 90.8 fL (ref 78.0–100.0)
Platelets: 326 10*3/uL (ref 150–400)
RDW: 13.7 % (ref 11.5–15.5)

## 2011-05-25 LAB — URINALYSIS, ROUTINE W REFLEX MICROSCOPIC
Bilirubin Urine: NEGATIVE
Ketones, ur: NEGATIVE mg/dL
Nitrite: POSITIVE — AB
Protein, ur: NEGATIVE mg/dL
Urobilinogen, UA: 0.2 mg/dL (ref 0.0–1.0)

## 2011-05-25 LAB — DIFFERENTIAL
Basophils Absolute: 0 10*3/uL (ref 0.0–0.1)
Eosinophils Absolute: 0.3 10*3/uL (ref 0.0–0.7)
Eosinophils Relative: 3 % (ref 0–5)
Monocytes Absolute: 0.5 10*3/uL (ref 0.1–1.0)

## 2011-05-25 LAB — URINE MICROSCOPIC-ADD ON

## 2011-05-25 LAB — BASIC METABOLIC PANEL
Calcium: 10.1 mg/dL (ref 8.4–10.5)
Creatinine, Ser: 0.57 mg/dL (ref 0.50–1.10)
GFR calc non Af Amer: >90 mL/min (ref >90)
Glucose, Bld: 90 mg/dL (ref 70–99)
Sodium: 140 mEq/L (ref 135–145)

## 2011-05-25 MED ORDER — SODIUM CHLORIDE 0.9 % IV BOLUS (SEPSIS)
500.0000 mL | Freq: Once | INTRAVENOUS | Status: AC
  Administered 2011-05-25: 500 mL via INTRAVENOUS

## 2011-05-25 MED ORDER — FENTANYL CITRATE 0.05 MG/ML IJ SOLN
50.0000 ug | Freq: Once | INTRAMUSCULAR | Status: AC
  Administered 2011-05-25: 50 ug via INTRAVENOUS
  Filled 2011-05-25: qty 2

## 2011-05-25 MED ORDER — SODIUM CHLORIDE 0.9 % IV SOLN
INTRAVENOUS | Status: DC
  Administered 2011-05-25: 16:00:00 via INTRAVENOUS

## 2011-05-25 MED ORDER — CEFUROXIME AXETIL 250 MG PO TABS: 500.0000 mg | ORAL_TABLET | Freq: Two times a day (BID) | ORAL | Status: AC

## 2011-05-25 MED ORDER — CEFTRIAXONE SODIUM 1 G IJ SOLR
1.0000 g | Freq: Once | INTRAMUSCULAR | Status: AC
  Administered 2011-05-25: 1 g via INTRAVENOUS
  Filled 2011-05-25: qty 10

## 2011-05-25 MED ORDER — ONDANSETRON HCL 4 MG/2ML IJ SOLN
4.0000 mg | Freq: Once | INTRAMUSCULAR | Status: AC
  Administered 2011-05-25: 4 mg via INTRAVENOUS
  Filled 2011-05-25: qty 2

## 2011-05-25 MED ORDER — HYDROMORPHONE HCL PF 1 MG/ML IJ SOLN
1.0000 mg | Freq: Once | INTRAMUSCULAR | Status: AC
  Administered 2011-05-25: 1 mg via INTRAVENOUS
  Filled 2011-05-25: qty 1

## 2011-05-25 MED ORDER — OXYCODONE-ACETAMINOPHEN 10-650 MG PO TABS: 1.0000 | ORAL_TABLET | Freq: Four times a day (QID) | ORAL | Status: DC | PRN

## 2011-05-25 NOTE — ED Notes (Signed)
Pt with right flank pain since last night, hx of kidney stones, noted blood in urine with last void, pt instructed to get an urine sample and pt verbalized understanding

## 2011-05-25 NOTE — ED Notes (Signed)
Right flank pain since last night with nausea.  Denies v/d.

## 2011-05-25 NOTE — Discharge Instructions (Signed)
Pyelonephritis, Adult Pyelonephritis is a kidney infection. A kidney infection can happen quickly, or it can last for a long time. HOME CARE   Take your medicine (antibiotics) as told. Finish it even if you start to feel better.   Keep all doctor visits as told.   Drink enough fluids to keep your pee (urine) clear or pale yellow.   Only take medicine as told by your doctor.  GET HELP RIGHT AWAY IF:   You have a fever.   You cannot take your medicine or drink fluids as told.   You have chills and shaking.   You feel very weak or pass out (faint).   You do not feel better after 2 days.  MAKE SURE YOU:  Understand these instructions.   Will watch your condition.   Will get help right away if you are not doing well or get worse.  Document Released: 03/20/2004 Document Revised: 01/30/2011 Document Reviewed: 07/31/2010 [employer] Patient Information 2012 [employer]. 

## 2011-05-25 NOTE — ED Notes (Signed)
20g IV d/c'd from right ac intact. No redness or edema at sight.

## 2011-05-25 NOTE — ED Provider Notes (Signed)
History   This chart was scribed for [employer]. Rubin Payor, MD scribed by Magnus Sinning. The patient was seen in room APA15/APA15 seen at 15:54   CSN: [id number]  Arrival date & time 05/25/11  1457   First MD Initiated Contact with Patient 05/25/11 1548      Chief Complaint  Patient presents with  . Flank Pain    (Consider location/radiation/quality/duration/timing/severity/associated sxs/prior treatment) HPI Karla Oconnor is a 31 y.o. female who presents to the Emergency Department complaining of constant severe right flank pain onset last night, with associated hematuria this morning. Reports she has not checked for a fever, but denies chills, vaginal bleeding, vaginal discharge, constipation, and diarrhea. Pt has a hx of kidney stones and gall bladder removal.   Urologist: Dr. Vick Frees in [place] Past Medical History  Diagnosis Date  . Sciatica   . Scoliosis   . Kidney stone   . Kidney infection   . Allergic reaction caused by a drug 03/26/2011    Intravenous Cipro    Past Surgical History  Procedure Date  . Kidney stone surgery   . Cesarean section   . Cholecystectomy     No family history on file.  History  Substance Use Topics  . Smoking status: Current Everyday Smoker -- 1.0 packs/day    Types: Cigarettes  . Smokeless tobacco: Not on file  . Alcohol Use: No    Review of Systems  Genitourinary: Positive for flank pain and vaginal bleeding.  All other systems reviewed and are negative.    Allergies  Ciprofloxacin in d5w; Codeine; Nubain; Sulfa antibiotics; and Toradol  Home Medications   Current Outpatient Rx  Name Route Sig Dispense Refill  . NITROFURANTOIN MACROCRYSTAL 100 MG PO CAPS Oral Take 100 mg by mouth daily.    Marland Kitchen CEFUROXIME AXETIL 250 MG PO TABS Oral Take 2 tablets (500 mg total) by mouth 2 (two) times daily. 28 tablet 0  . OXYCODONE-ACETAMINOPHEN 10-650 MG PO TABS Oral Take 1 tablet by mouth every 6 (six) hours as needed. pain 10  tablet 0    BP 117/83  Pulse 123  Temp(Src) 98.7 F (37.1 C) (Oral)  Resp 22  Ht 5\' 3"  (1.6 m)  Wt 125 lb (56.7 kg)  BMI 22.14 kg/m2  SpO2 100%  LMP 05/11/2011  Physical Exam  Nursing note and vitals reviewed. Constitutional: She is oriented to person, place, and time. She appears well-developed and well-nourished. No distress.       Appears uncomfortable   HENT:  Head: Normocephalic and atraumatic.  Eyes: EOM are normal. Pupils are equal, round, and reactive to light.  Neck: Neck supple. No tracheal deviation present.  Cardiovascular: Tachycardia present.   Pulmonary/Chest: Effort normal. No respiratory distress.  Abdominal: Soft. She exhibits no distension. There is tenderness. There is CVA tenderness. There is no rebound and no guarding.  Musculoskeletal: Normal range of motion. She exhibits no edema.  Neurological: She is alert and oriented to person, place, and time. No sensory deficit.  Skin: Skin is warm and dry.  Psychiatric: She has a normal mood and affect. Her behavior is normal.    ED Course  Procedures (including critical care time) DIAGNOSTIC STUDIES: Oxygen Saturation is 100% on room air, normal by my interpretation.    COORDINATION OF CARE:  Medication Orders Given at 1600: HYDROmorphone (DILAUDID) injection 1 mg Once  Ondansetron (ZOFRAN) injection 4 mg Once  Sodium chloride 0.9 % bolus 500 mL Once  0.9 % Sodium chloride  infusion Continuous   Labs Reviewed  URINALYSIS, ROUTINE W REFLEX MICROSCOPIC - Abnormal; Notable for the following:    APPearance HAZY (*)    Hgb urine dipstick LARGE (*)    Nitrite POSITIVE (*)    Leukocytes, UA SMALL (*)    All other components within normal limits  URINE MICROSCOPIC-ADD ON - Abnormal; Notable for the following:    Squamous Epithelial / LPF FEW (*)    Bacteria, UA MANY (*)    All other components within normal limits  PREGNANCY, URINE  CBC  DIFFERENTIAL  BASIC METABOLIC PANEL  URINE CULTURE    1.  Pyelonephritis       MDM  Right flank pain. History of same. Urinalysis shows infection. She's had recent CAT scans did not show any stones. She be treated with Ceftin since she is allergic to Cipro and is currently on Macrobid. She'll follow with urology in one to 2 days   I personally performed the services described in this documentation, which was scribed in my presence. The recorded information has been reviewed and considered.          Juliet Rude. Rubin Payor, MD 05/25/11 2009

## 2012-01-01 ENCOUNTER — Emergency Department (HOSPITAL_COMMUNITY)
Admission: EM | Admit: 2012-01-01 | Discharge: 2012-01-01 | Disposition: A | Discharge status: Home / Self Care | Payer: Medicaid Other | Attending: Emergency Medicine | Admitting: Emergency Medicine

## 2012-01-01 ENCOUNTER — Encounter (HOSPITAL_COMMUNITY): Payer: Self-pay

## 2012-01-01 DIAGNOSIS — Z79899 Other long term (current) drug therapy: Secondary | ICD-10-CM | POA: Insufficient documentation

## 2012-01-01 DIAGNOSIS — N390 Urinary tract infection, site not specified: Secondary | ICD-10-CM | POA: Insufficient documentation

## 2012-01-01 DIAGNOSIS — F17200 Nicotine dependence, unspecified, uncomplicated: Secondary | ICD-10-CM | POA: Insufficient documentation

## 2012-01-01 DIAGNOSIS — N200 Calculus of kidney: Secondary | ICD-10-CM | POA: Insufficient documentation

## 2012-01-01 DIAGNOSIS — Z8739 Personal history of other diseases of the musculoskeletal system and connective tissue: Secondary | ICD-10-CM | POA: Insufficient documentation

## 2012-01-01 LAB — URINE MICROSCOPIC-ADD ON

## 2012-01-01 LAB — URINALYSIS, ROUTINE W REFLEX MICROSCOPIC
Glucose, UA: NEGATIVE mg/dL
Leukocytes, UA: NEGATIVE
Protein, ur: NEGATIVE mg/dL
pH: 5.5 (ref 5.0–8.0)

## 2012-01-01 LAB — PREGNANCY, URINE: Preg Test, Ur: NEGATIVE

## 2012-01-01 MED ORDER — CIPROFLOXACIN HCL 500 MG PO TABS: 500.0000 mg | ORAL_TABLET | Freq: Two times a day (BID) | ORAL | Status: DC

## 2012-01-01 NOTE — ED Provider Notes (Signed)
History  This chart was scribed for Geoffery Lyons, MD by Ardeen Jourdain. This patient was seen in room APA12/APA12 and the patient's care was started at 1915.  CSN: [id number]  Arrival date & time 01/01/12  1191   First MD Initiated Contact with Patient 01/01/12 1915      Chief Complaint  Patient presents with  . Urinary Tract Infection  . Flank Pain     The history is provided by the patient. No language interpreter was used.    Karla Oconnor is a 31 y.o. female who presents to the Emergency Department complaining of urinary problems with associated back pain as well as foul smelling and dark urine. She denies vaginal bleeding, vaginal discharge, painful urination and the possibility of being pregnant. She states that the pain radiates down her leg as well. She reports that the pain is similar to when she has had a UTI in the past. She has been prescribed medication for her frequent UTI, however she states it is no longer working. Her LMP was the 14th of October. She has a h/o sciatica, kidney stones and kidney infection. She is a current everyday smoker but denies alcohol use.     Past Medical History  Diagnosis Date  . Sciatica   . Scoliosis   . Kidney stone   . Kidney infection   . Allergic reaction caused by a drug 03/26/2011    Intravenous Cipro    Past Surgical History  Procedure Date  . Kidney stone surgery   . Cesarean section   . Cholecystectomy     History reviewed. No pertinent family history.  History  Substance Use Topics  . Smoking status: Current Every Day Smoker -- 1.0 packs/day    Types: Cigarettes  . Smokeless tobacco: Not on file  . Alcohol Use: No   No OB history available.   Review of Systems  All other systems reviewed and are negative.    A complete 10 system review of systems was obtained and all systems are negative except as noted in the HPI and PMH.    Allergies  Ciprofloxacin in d5w; Codeine; Ketorolac tromethamine; Nubain; and  Sulfa antibiotics  Home Medications   Current Outpatient Rx  Name  Route  Sig  Dispense  Refill  . NITROFURANTOIN MACROCRYSTAL 100 MG PO CAPS   Oral   Take 100 mg by mouth daily.         . OXYCODONE-ACETAMINOPHEN 10-650 MG PO TABS   Oral   Take 1 tablet by mouth every 6 (six) hours as needed. pain   10 tablet   0     Triage Vitals: BP 123/84  Pulse 91  Temp 98.4 F (36.9 C) (Oral)  Resp 16  Ht 5\' 3"  (1.6 m)  Wt 130 lb (58.968 kg)  BMI 23.03 kg/m2  SpO2 100%  LMP 12/08/2011  Physical Exam  Nursing note and vitals reviewed. Constitutional: She is oriented to person, place, and time. She appears well-developed and well-nourished. No distress.  HENT:  Head: Normocephalic and atraumatic.  Eyes: EOM are normal. Pupils are equal, round, and reactive to light.  Neck: Normal range of motion. Neck supple. No tracheal deviation present.  Cardiovascular: Normal rate, regular rhythm and normal heart sounds.   Pulmonary/Chest: Effort normal and breath sounds normal. No respiratory distress.  Abdominal: Soft. Bowel sounds are normal. She exhibits no distension. There is no rebound and no guarding.       TTP in RUQ and RLQ  Musculoskeletal: Normal range of motion. She exhibits no edema.  Neurological: She is alert and oriented to person, place, and time.  Skin: Skin is warm and dry.  Psychiatric: She has a normal mood and affect. Her behavior is normal.    ED Course  Procedures (including critical care time)  DIAGNOSTIC STUDIES: Oxygen Saturation is 100% on room air, normal by my interpretation.    COORDINATION OF CARE:  7:25 PM: Discussed treatment plan which includes a urinalysis with pt at bedside and pt agreed to plan.     Labs Reviewed  URINALYSIS, ROUTINE W REFLEX MICROSCOPIC  PREGNANCY, URINE   No results found.   No diagnosis found.    MDM  The urine shows a uti.  Will treat with cipro, return prn.      I personally performed the services  described in this documentation, which was scribed in my presence. The recorded information has been reviewed and is accurate.      Geoffery Lyons, MD 01/01/12 [phone]

## 2012-01-01 NOTE — ED Notes (Signed)
My urine is really dark and has a strong odor to it. Having pain in my in my right flank going into my right leg. Started about 2 weeks ago, have been drinking a lot of water and it hasn't helped. Probably need antibiotics.

## 2012-01-01 NOTE — ED Notes (Signed)
Pt presents with urinary discomfort, dysuria, hematuria and pain/spasms in lower abdomin x 2 weeks. Denies fever, n/v. States has been drinking a lot of water and cranberry juice without relief. Urine sample collected with noted blood in urine. No needs voiced at this time.

## 2012-01-03 LAB — URINE CULTURE: Colony Count: >=100000

## 2012-01-04 NOTE — ED Notes (Signed)
+  Urine. Patient treated with Cipro. Sensitive to same. Per protocol MD. °

## 2012-09-10 HISTORY — PX: DILATION AND CURETTAGE OF UTERUS: SHX78

## 2012-09-27 ENCOUNTER — Ambulatory Visit (INDEPENDENT_AMBULATORY_CARE_PROVIDER_SITE_OTHER): Payer: Medicaid Other | Admitting: Adult Health

## 2012-09-27 ENCOUNTER — Encounter: Payer: Self-pay | Admitting: Adult Health

## 2012-09-27 VITALS — BP 120/60 | Ht 63.0 in | Wt 149.5 lb

## 2012-09-27 DIAGNOSIS — F32A Depression, unspecified: Secondary | ICD-10-CM

## 2012-09-27 DIAGNOSIS — F329 Major depressive disorder, single episode, unspecified: Secondary | ICD-10-CM | POA: Insufficient documentation

## 2012-09-27 HISTORY — DX: Depression, unspecified: F32.A

## 2012-09-27 MED ORDER — ESCITALOPRAM OXALATE 10 MG PO TABS: 10.0000 mg | ORAL_TABLET | Freq: Every day | ORAL | Status: DC

## 2012-09-27 NOTE — Patient Instructions (Addendum)

## 2012-09-27 NOTE — Progress Notes (Signed)
Subjective:     Patient ID: Karla Oconnor, female   DOB: 03-23-1980, 32 y.o.   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 Has son with her today.  Review of Systems Positives in HPI   Reviewed past medical,surgical, social and family history. Reviewed medications and allergies.  Objective:   Physical Exam BP 120/60  Ht 5\' 3"  (1.6 m)  Wt 149 lb 8 oz (67.813 kg)  BMI 26.49 kg/m2  LMP 09/20/2012   Discussed with her that it is not her fault, that miscarriages happen early for reasons.But it is not her fault, because of her blood type. Keep appt at [facility] Assessment:     Depression     Plan:     Rx lexapro 10 mg 1 daily #30 with 6 refills Review handout on rh- blood and rhogram and in understanding  Miscarriage by [facility]   Follow up in 3 weeks  Call prn, if needs to talk If has hives and itching can try benadryl 25 mg

## 2012-10-18 ENCOUNTER — Ambulatory Visit: Payer: Medicaid Other | Admitting: Adult Health

## 2013-03-28 ENCOUNTER — Other Ambulatory Visit: Payer: Medicaid Other | Admitting: Obstetrics & Gynecology

## 2013-03-30 ENCOUNTER — Other Ambulatory Visit: Payer: Medicaid Other | Admitting: Obstetrics & Gynecology

## 2013-04-06 ENCOUNTER — Encounter (INDEPENDENT_AMBULATORY_CARE_PROVIDER_SITE_OTHER): Payer: Self-pay

## 2013-04-06 ENCOUNTER — Ambulatory Visit (INDEPENDENT_AMBULATORY_CARE_PROVIDER_SITE_OTHER): Payer: Medicaid Other | Admitting: Obstetrics & Gynecology

## 2013-04-06 ENCOUNTER — Other Ambulatory Visit (HOSPITAL_COMMUNITY)
Admission: RE | Admit: 2013-04-06 | Discharge: 2013-04-06 | Disposition: A | Discharge status: Home / Self Care | Payer: Medicaid Other | Source: Ambulatory Visit | Attending: Obstetrics & Gynecology | Admitting: Obstetrics & Gynecology

## 2013-04-06 ENCOUNTER — Encounter: Payer: Self-pay | Admitting: Obstetrics & Gynecology

## 2013-04-06 VITALS — BP 100/70 | Ht 63.0 in | Wt 152.0 lb

## 2013-04-06 DIAGNOSIS — Z01419 Encounter for gynecological examination (general) (routine) without abnormal findings: Secondary | ICD-10-CM

## 2013-04-06 DIAGNOSIS — Z0000 Encounter for general adult medical examination without abnormal findings: Secondary | ICD-10-CM

## 2013-04-06 DIAGNOSIS — Z1151 Encounter for screening for human papillomavirus (HPV): Secondary | ICD-10-CM | POA: Insufficient documentation

## 2013-04-06 MED ORDER — ETONOGESTREL-ETHINYL ESTRADIOL 0.12-0.015 MG/24HR VA RING: VAGINAL_RING | VAGINAL | Status: DC

## 2013-04-06 NOTE — Progress Notes (Signed)
Patient ID: [name], female   DOB: 1980-09-12, 33 y.o.   MRN: [id number] Subjective:     Karla Oconnor is a 33 y.o. female here for a routine exam.  Patient's last menstrual period was 04/04/2013. [id number] Birth Control Method:  nuva ring Menstrual Calendar(currently): regular  Current complaints: none.   Current acute medical issues:  none   Recent Gynecologic History Patient's last menstrual period was 04/04/2013. Last Pap: 2011,  normal Last mammogram: na,  na  Past Medical History  Diagnosis Date  . Sciatica   . Scoliosis   . Kidney stone   . Kidney infection   . Allergic reaction caused by a drug 03/26/2011    Intravenous Cipro  . Depression 09/27/2012    Past Surgical History  Procedure Laterality Date  . Kidney stone surgery    . Cesarean section    . Cholecystectomy    . Dilation and curettage of uterus  09/10/12    OB History   Grav Para Term Preterm Abortions TAB SAB Ect Mult Living   2 1   1  1   1       History   Social History  . Marital Status: Single    Spouse Name: N/A    Number of Children: N/A  . Years of Education: N/A   Social History Main Topics  . Smoking status: Current Every Day Smoker -- 1.00 packs/day    Types: Cigarettes  . Smokeless tobacco: Never Used  . Alcohol Use: Yes     Comment: occ wine  . Drug Use: No  . Sexual Activity: Not Currently    Birth Control/ Protection: Inserts   Other Topics Concern  . None   Social History Narrative  . None    Family History  Problem Relation Age of Onset  . Diabetes Mother   . Bipolar disorder Mother   . Cancer Maternal Aunt   . Lupus Maternal Aunt   . Cirrhosis Maternal Aunt      Review of Systems  Review of Systems  Constitutional: Negative for fever, chills, weight loss, malaise/fatigue and diaphoresis.  HENT: Negative for hearing loss, ear pain, nosebleeds, congestion, sore throat, neck pain, tinnitus and ear discharge.   Eyes: Negative for blurred vision, double  vision, photophobia, pain, discharge and redness.  Respiratory: Negative for cough, hemoptysis, sputum production, shortness of breath, wheezing and stridor.   Cardiovascular: Negative for chest pain, palpitations, orthopnea, claudication, leg swelling and PND.  Gastrointestinal: negative for abdominal pain. Negative for heartburn, nausea, vomiting, diarrhea, constipation, blood in stool and melena.  Genitourinary: Negative for dysuria, urgency, frequency, hematuria and flank pain.  Musculoskeletal: Negative for myalgias, back pain, joint pain and falls.  Skin: Negative for itching and rash.  Neurological: Negative for dizziness, tingling, tremors, sensory change, speech change, focal weakness, seizures, loss of consciousness, weakness and headaches.  Endo/Heme/Allergies: Negative for environmental allergies and polydipsia. Does not bruise/bleed easily.  Psychiatric/Behavioral: Negative for depression, suicidal ideas, hallucinations, memory loss and substance abuse. The patient is not nervous/anxious and does not have insomnia.        Objective:    Physical Exam  Vitals reviewed. Constitutional: She is oriented to person, place, and time. She appears well-developed and well-nourished.  HENT:  Head: Normocephalic and atraumatic.        Right Ear: External ear normal.  Left Ear: External ear normal.  Nose: Nose normal.  Mouth/Throat: Oropharynx is clear and moist.  Eyes: Conjunctivae and EOM are normal. Pupils  are equal, round, and reactive to light. Right eye exhibits no discharge. Left eye exhibits no discharge. No scleral icterus.  Neck: Normal range of motion. Neck supple. No tracheal deviation present. No thyromegaly present.  Cardiovascular: Normal rate, regular rhythm, normal heart sounds and intact distal pulses.  Exam reveals no gallop and no friction rub.   No murmur heard. Respiratory: Effort normal and breath sounds normal. No respiratory distress. She has no wheezes. She has no  rales. She exhibits no tenderness.  GI: Soft. Bowel sounds are normal. She exhibits no distension and no mass. There is no tenderness. There is no rebound and no guarding.  Genitourinary:  Breasts no masses skin changes or nipple changes bilaterally      Vulva is normal without lesions Vagina is pink moist without discharge Cervix normal in appearance and pap is done Uterus is normal size shape and contour Adnexa is negative with normal sized ovaries    Musculoskeletal: Normal range of motion. She exhibits no edema and no tenderness.  Neurological: She is alert and oriented to person, place, and time. She has normal reflexes. She displays normal reflexes. No cranial nerve deficit. She exhibits normal muscle tone. Coordination normal.  Skin: Skin is warm and dry. No rash noted. No erythema. No pallor.  Psychiatric: She has a normal mood and affect. Her behavior is normal. Judgment and thought content normal.       Assessment:    Healthy female exam.    Plan:    Contraception: NuvaRing vaginal inserts. Follow up in: 1 year.

## 2013-04-06 NOTE — Addendum Note (Signed)
Addended by: Richardson ChiquitoRAVIS, Samaad Hashem M on: 04/06/2013 04:50 PM   Modules accepted: Orders

## 2013-04-19 ENCOUNTER — Emergency Department (HOSPITAL_COMMUNITY): Payer: Medicaid Other

## 2013-04-19 ENCOUNTER — Encounter (HOSPITAL_COMMUNITY): Payer: Self-pay | Admitting: Emergency Medicine

## 2013-04-19 ENCOUNTER — Emergency Department (HOSPITAL_COMMUNITY)
Admission: EM | Admit: 2013-04-19 | Discharge: 2013-04-20 | Disposition: A | Discharge status: Home / Self Care | Payer: Medicaid Other | Attending: Emergency Medicine | Admitting: Emergency Medicine

## 2013-04-19 DIAGNOSIS — Z87442 Personal history of urinary calculi: Secondary | ICD-10-CM | POA: Insufficient documentation

## 2013-04-19 DIAGNOSIS — Y929 Unspecified place or not applicable: Secondary | ICD-10-CM | POA: Insufficient documentation

## 2013-04-19 DIAGNOSIS — S8392XA Sprain of unspecified site of left knee, initial encounter: Secondary | ICD-10-CM

## 2013-04-19 DIAGNOSIS — W010XXA Fall on same level from slipping, tripping and stumbling without subsequent striking against object, initial encounter: Secondary | ICD-10-CM | POA: Insufficient documentation

## 2013-04-19 DIAGNOSIS — Z8739 Personal history of other diseases of the musculoskeletal system and connective tissue: Secondary | ICD-10-CM | POA: Insufficient documentation

## 2013-04-19 DIAGNOSIS — S8000XA Contusion of unspecified knee, initial encounter: Secondary | ICD-10-CM | POA: Insufficient documentation

## 2013-04-19 DIAGNOSIS — F329 Major depressive disorder, single episode, unspecified: Secondary | ICD-10-CM | POA: Insufficient documentation

## 2013-04-19 DIAGNOSIS — Z87448 Personal history of other diseases of urinary system: Secondary | ICD-10-CM | POA: Insufficient documentation

## 2013-04-19 DIAGNOSIS — Y9389 Activity, other specified: Secondary | ICD-10-CM | POA: Insufficient documentation

## 2013-04-19 DIAGNOSIS — Z79899 Other long term (current) drug therapy: Secondary | ICD-10-CM | POA: Insufficient documentation

## 2013-04-19 DIAGNOSIS — IMO0002 Reserved for concepts with insufficient information to code with codable children: Secondary | ICD-10-CM | POA: Insufficient documentation

## 2013-04-19 DIAGNOSIS — F3289 Other specified depressive episodes: Secondary | ICD-10-CM | POA: Insufficient documentation

## 2013-04-19 DIAGNOSIS — F17200 Nicotine dependence, unspecified, uncomplicated: Secondary | ICD-10-CM | POA: Insufficient documentation

## 2013-04-19 MED ORDER — TRAMADOL HCL 50 MG PO TABS
50.0000 mg | ORAL_TABLET | Freq: Once | ORAL | Status: DC
  Filled 2013-04-19: qty 1

## 2013-04-19 MED ORDER — TRAMADOL HCL 50 MG PO TABS: 50.0000 mg | ORAL_TABLET | Freq: Four times a day (QID) | ORAL | Status: DC | PRN

## 2013-04-19 NOTE — ED Notes (Signed)
Patient states she was jogging to her car after paying for gas and fell on the ice and landed on left knee.  Patient has abrasions and bruising noted to left knee.  Patient had to be at work and has worked a shift on her knee.

## 2013-04-19 NOTE — ED Notes (Signed)
Applied ice pack to left knee.

## 2013-04-19 NOTE — Discharge Instructions (Signed)
Knee Sprain A knee sprain is a tear in one of the strong, fibrous tissues that connect the bones (ligaments) in your knee. The severity of the sprain depends on how much of the ligament is torn. The tear can be either partial or complete. CAUSES  Often, sprains are a result of a fall or injury. The force of the impact causes the fibers of your ligament to stretch too much. This excess tension causes the fibers of your ligament to tear. SIGNS AND SYMPTOMS  You may have some loss of motion in your knee. Other symptoms include:  Bruising.  Pain in the knee area.  Tenderness of the knee to the touch.  Swelling. DIAGNOSIS  To diagnose a knee sprain, your health care provider will physically examine your knee. Your health care provider may also suggest an X-ray exam of your knee to make sure no bones are broken. TREATMENT  If your ligament is only partially torn, treatment usually involves keeping the knee in a fixed position (immobilization) or bracing your knee for activities that require movement for several weeks. To do this, your health care provider will apply a bandage, cast, or splint to keep your knee from moving and to support your knee during movement until it heals. For a partially torn ligament, the healing process usually takes 4 6 weeks. If your ligament is completely torn, depending on which ligament it is, you may need surgery to reconnect the ligament to the bone or reconstruct it. After surgery, a cast or splint may be applied and will need to stay on your knee for 4 6 weeks while your ligament heals. HOME CARE INSTRUCTIONS  Keep your injured knee elevated to decrease swelling.  To ease pain and swelling, apply ice to the injured area:  Put ice in a plastic bag.  Place a towel between your skin and the bag.  Leave the ice on for 20 minutes, 2 3 times a day.  Only take medicine for pain as directed by your health care provider.  Do not leave your knee unprotected until  pain and stiffness go away (usually 4 6 weeks).  If you have a cast or splint, do not allow it to get wet. If you have been instructed not to remove it, cover it with a plastic bag when you shower or bathe. Do not swim.  Your health care provider may suggest exercises for you to do during your recovery to prevent or limit permanent weakness and stiffness. SEEK IMMEDIATE MEDICAL CARE IF:  Your cast or splint becomes damaged.  Your pain becomes worse.  You have significant pain, swelling, or numbness below the cast or splint. MAKE SURE YOU:  Understand these instructions.  Will watch your condition.  Will get help right away if you are not doing well or get worse. Document Released: 02/10/2005 Document Revised: 12/01/2012 Document Reviewed: 09/22/2012 [facility] Patient Information 2014 [place].  Use the medicine prescribed for your pain, taking your home ibuprofen (600mg  for a total of 3 tablets) every 8 hours - make sure you take this with food.  Ice and elevate as much as possible for the next 2 days,  You may add a heating pad 20 minutes several times daily starting on Friday.  Use your crutches as needed until you can comfortably stand without them.

## 2013-04-20 MED ORDER — HYDROCODONE-ACETAMINOPHEN 5-325 MG PO TABS: 1.0000 | ORAL_TABLET | ORAL | Status: DC | PRN

## 2013-04-20 MED ORDER — HYDROCODONE-ACETAMINOPHEN 5-325 MG PO TABS
1.0000 | ORAL_TABLET | Freq: Once | ORAL | Status: AC
  Administered 2013-04-20: 1 via ORAL
  Filled 2013-04-20: qty 1

## 2013-04-20 NOTE — ED Notes (Signed)
Patient states she is allergic to Tramadol. Advised Raynelle FanningJulie Idol PA.

## 2013-04-21 NOTE — ED Provider Notes (Signed)
Medical screening examination/treatment/procedure(s) were performed by non-physician practitioner and as supervising physician I was immediately available for consultation/collaboration.   Dione Boozeavid Aashi Derrington, MD 04/21/13 [phone]

## 2013-04-21 NOTE — ED Provider Notes (Signed)
CSN: [id number]     Arrival date & time 04/19/13  2244 History   First MD Initiated Contact with Patient 04/19/13 2256     Chief Complaint  Patient presents with  . Knee Injury     (Consider location/radiation/quality/duration/timing/severity/associated sxs/prior Treatment) HPI Comments: Karla Oconnor is a 33 y.o. 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  Her pain is constant,  And worse now that she was forced to stand all day.  She denies radiation of pain which is constant, throbbing, worse with palpation and attempts to bend the knee.  She has taken no medicines prior to arrival.  She has applied ice since arriving here without significant improvement in pain.  She denies pain in her left hip and ankle.  The history is provided by the patient.    Past Medical History  Diagnosis Date  . Sciatica   . Scoliosis   . Kidney stone   . Kidney infection   . Allergic reaction caused by a drug 03/26/2011    Intravenous Cipro  . Depression 09/27/2012   Past Surgical History  Procedure Laterality Date  . Kidney stone surgery    . Cesarean section    . Cholecystectomy    . Dilation and curettage of uterus  09/10/12   Family History  Problem Relation Age of Onset  . Diabetes Mother   . Bipolar disorder Mother   . Cancer Maternal Aunt   . Lupus Maternal Aunt   . Cirrhosis Maternal Aunt    History  Substance Use Topics  . Smoking status: Current Every Day Smoker -- 1.00 packs/day    Types: Cigarettes  . Smokeless tobacco: Never Used  . Alcohol Use: Yes     Comment: occ wine   OB History   Grav Para Term Preterm Abortions TAB SAB Ect Mult Living   2 1   1  1   1      Review of Systems  Constitutional: Negative for fever.  Musculoskeletal: Positive for arthralgias and joint swelling. Negative for myalgias.  Neurological:  Negative for weakness and numbness.      Allergies  Ciprofloxacin in d5w; Codeine; Nubain; Sulfa antibiotics; and Toradol  Home Medications   Current Outpatient Rx  Name  Route  Sig  Dispense  Refill  . escitalopram (LEXAPRO) 10 MG tablet   Oral   Take 1 tablet (10 mg total) by mouth daily.   30 tablet   6   . etonogestrel-ethinyl estradiol (NUVARING) 0.12-0.015 MG/24HR vaginal ring      Insert vaginally and leave in place for 3 consecutive weeks, then remove for 1 week.   1 each   11   . HYDROcodone-acetaminophen (NORCO/VICODIN) 5-325 MG per tablet   Oral   Take 1 tablet by mouth every 4 (four) hours as needed for moderate pain.   15 tablet   0    BP 137/84  Pulse 116  Temp(Src) 98.3 F (36.8 C) (Oral)  Resp 16  Ht 5\' 3"  (1.6 m)  Wt 154 lb (69.854 kg)  BMI 27.29 kg/m2  SpO2 99%  LMP 04/11/2013 Physical Exam  Constitutional: She appears well-developed and well-nourished.  HENT:  Head: Atraumatic.  Neck: Normal range of motion.  Cardiovascular:  Pulses:      Dorsalis pedis pulses are 2+ on the right side, and 2+  on the left side.  Musculoskeletal: She exhibits tenderness.       Left knee: She exhibits swelling and ecchymosis. She exhibits no LCL laxity and no MCL laxity. Tenderness found.  Moderate edema of anterior left patella with ecchymosis.  No dependent edema or ecchymosis.  No ligament instability on exam, although increased pain with valgus and varus strain.  No crepitus with limited range of motion.   Neurological: She is alert. She has normal strength. She displays normal reflexes. No sensory deficit.  Skin: Skin is warm and dry.  Psychiatric: She has a normal mood and affect.    ED Course  Procedures (including critical care time) Labs Review Labs Reviewed - No data to display Imaging Review Dg Knee Complete 4 Views Left  04/19/2013   CLINICAL DATA:  33 year old female with left knee injury and pain.  EXAM: LEFT KNEE - COMPLETE 4+ VIEW   COMPARISON:  None  FINDINGS: There is no evidence of fracture, subluxation or dislocation.  The joint space is unremarkable.  There is no evidence of joint effusion.  No focal bony lesions are present.  IMPRESSION: Negative.   Electronically Signed   By: Laveda AbbeJeff  Hu M.D.   On: 04/19/2013 23:14    EKG Interpretation   None       MDM   Final diagnoses:  Left knee sprain    Patients labs and/or radiological studies were viewed and considered during the medical decision making and disposition process. X-rays are negative for acute bony injury.  She was prescribed hydrocodone for pain relief, also encouraged ibuprofen 600 mg every 8 hours which she has at home.  Ice and elevation.  She has crutches at home, encouraged to use these when necessary pain.  Referral to Dr. Romeo AppleHarrison for recheck of her symptoms if not improved over the next week.  The patient appears reasonably screened and/or stabilized for discharge and I doubt any other medical condition or other [facility] requiring further screening, evaluation, or treatment in the ED at this time prior to discharge.     Burgess AmorJulie Daelon Dunivan, PA-C 04/21/13 [phone]

## 2013-12-26 ENCOUNTER — Encounter (HOSPITAL_COMMUNITY): Payer: Self-pay | Admitting: Emergency Medicine

## 2014-05-08 ENCOUNTER — Other Ambulatory Visit: Payer: Medicaid Other | Admitting: Women's Health

## 2014-08-07 ENCOUNTER — Other Ambulatory Visit: Payer: Medicaid Other | Admitting: Women's Health

## 2014-08-14 ENCOUNTER — Other Ambulatory Visit: Payer: Medicaid Other | Admitting: Women's Health

## 2014-08-14 ENCOUNTER — Encounter: Payer: Self-pay | Admitting: *Deleted

## 2015-07-17 ENCOUNTER — Other Ambulatory Visit (HOSPITAL_COMMUNITY)
Admission: RE | Admit: 2015-07-17 | Discharge: 2015-07-17 | Disposition: A | Discharge status: Home / Self Care | Payer: Medicaid Other | Source: Ambulatory Visit | Attending: Obstetrics & Gynecology | Admitting: Obstetrics & Gynecology

## 2015-07-17 ENCOUNTER — Ambulatory Visit (INDEPENDENT_AMBULATORY_CARE_PROVIDER_SITE_OTHER): Payer: Medicaid Other | Admitting: Obstetrics & Gynecology

## 2015-07-17 ENCOUNTER — Encounter: Payer: Self-pay | Admitting: Obstetrics & Gynecology

## 2015-07-17 VITALS — BP 120/80 | HR 76 | Ht 63.0 in | Wt 150.0 lb

## 2015-07-17 DIAGNOSIS — Z01419 Encounter for gynecological examination (general) (routine) without abnormal findings: Secondary | ICD-10-CM | POA: Insufficient documentation

## 2015-07-17 DIAGNOSIS — Z0000 Encounter for general adult medical examination without abnormal findings: Secondary | ICD-10-CM | POA: Diagnosis not present

## 2015-07-17 MED ORDER — ETONOGESTREL-ETHINYL ESTRADIOL 0.12-0.015 MG/24HR VA RING: VAGINAL_RING | VAGINAL | Status: DC

## 2015-07-17 NOTE — Progress Notes (Signed)
Patient ID: Karla Oconnor, female   DOB: 01-23-1981, 35 y.o.   MRN: [id number] Subjective:     Karla Oconnor is a 35 y.o. female here for a routine exam.  Patient's last menstrual period was 07/10/2015. [id number] Birth Control Method:  none Menstrual Calendar(currently): regular heavy 7 days clots painful  Current complaints: wants BCM, desires nuva ring.   Current acute medical issues:  none   Recent Gynecologic History Patient's last menstrual period was 07/10/2015. Last Pap: 03/2013,  normal Last mammogram: ,    Past Medical History  Diagnosis Date  . Sciatica   . Scoliosis   . Kidney stone   . Kidney infection   . Allergic reaction caused by a drug 03/26/2011    Intravenous Cipro  . Depression 09/27/2012    Past Surgical History  Procedure Laterality Date  . Kidney stone surgery    . Cesarean section    . Cholecystectomy    . Dilation and curettage of uterus  09/10/12    OB History    Gravida Para Term Preterm AB TAB SAB Ectopic Multiple Living   Social History   Social History  . Marital Status: Single    Spouse Name: N/A  . Number of Children: N/A  . Years of Education: N/A   Social History Main Topics  . Smoking status: Current Every Day Smoker -- 1.00 packs/day    Types: Cigarettes  . Smokeless tobacco: Never Used  . Alcohol Use: Yes     Comment: occ wine  . Drug Use: No  . Sexual Activity: Not Currently    Birth Control/ Protection: Inserts   Other Topics Concern  . None   Social History Narrative    Family History  Problem Relation Age of Onset  . Diabetes Mother   . Bipolar disorder Mother   . Cancer Maternal Aunt   . Lupus Maternal Aunt   . Cirrhosis Maternal Aunt      Current outpatient prescriptions:  .  etonogestrel-ethinyl estradiol (NUVARING) 0.12-0.015 MG/24HR vaginal ring, Insert vaginally and leave in place for 3 consecutive weeks, then remove for 1 week., Disp: 1 each, Rfl: 12  Review of Systems  Review  of Systems  Constitutional: Negative for fever, chills, weight loss, malaise/fatigue and diaphoresis.  HENT: Negative for hearing loss, ear pain, nosebleeds, congestion, sore throat, neck pain, tinnitus and ear discharge.   Eyes: Negative for blurred vision, double vision, photophobia, pain, discharge and redness.  Respiratory: Negative for cough, hemoptysis, sputum production, shortness of breath, wheezing and stridor.   Cardiovascular: Negative for chest pain, palpitations, orthopnea, claudication, leg swelling and PND.  Gastrointestinal: negative for abdominal pain. Negative for heartburn, nausea, vomiting, diarrhea, constipation, blood in stool and melena.  Genitourinary: Negative for dysuria, urgency, frequency, hematuria and flank pain.  Musculoskeletal: Negative for myalgias, back pain, joint pain and falls.  Skin: Negative for itching and rash.  Neurological: Negative for dizziness, tingling, tremors, sensory change, speech change, focal weakness, seizures, loss of consciousness, weakness and headaches.  Endo/Heme/Allergies: Negative for environmental allergies and polydipsia. Does not bruise/bleed easily.  Psychiatric/Behavioral: Negative for depression, suicidal ideas, hallucinations, memory loss and substance abuse. The patient is not nervous/anxious and does not have insomnia.        Objective:  Blood pressure 120/80, pulse 76, height  (1.6 m), weight 150 lb (68.04 kg), last menstrual period 07/10/2015.   Physical Exam  Vitals  reviewed. Constitutional: She is oriented to person, place, and time. She appears well-developed and well-nourished.  HENT:  Head: Normocephalic and atraumatic.        Right Ear: External ear normal.  Left Ear: External ear normal.  Nose: Nose normal.  Mouth/Throat: Oropharynx is clear and moist.  Eyes: Conjunctivae and EOM are normal. Pupils are equal, round, and reactive to light. Right eye exhibits no discharge. Left eye exhibits no discharge. No  scleral icterus.  Neck: Normal range of motion. Neck supple. No tracheal deviation present. No thyromegaly present.  Cardiovascular: Normal rate, regular rhythm, normal heart sounds and intact distal pulses.  Exam reveals no gallop and no friction rub.   No murmur heard. Respiratory: Effort normal and breath sounds normal. No respiratory distress. She has no wheezes. She has no rales. She exhibits no tenderness.  GI: Soft. Bowel sounds are normal. She exhibits no distension and no mass. There is no tenderness. There is no rebound and no guarding.  Genitourinary:  Breasts no masses skin changes or nipple changes bilaterally      Vulva is normal without lesions Vagina is pink moist without discharge Cervix normal in appearance and pap is done Uterus is normal size shape and contour Adnexa is negative with normal sized ovaries   Musculoskeletal: Normal range of motion. She exhibits no edema and no tenderness.  Neurological: She is alert and oriented to person, place, and time. She has normal reflexes. She displays normal reflexes. No cranial nerve deficit. She exhibits normal muscle tone. Coordination normal.  Skin: Skin is warm and dry. No rash noted. No erythema. No pallor.  Psychiatric: She has a normal mood and affect. Her behavior is normal. Judgment and thought content normal.       Medications Ordered at today's visit: Meds ordered this encounter  Medications  . etonogestrel-ethinyl estradiol (NUVARING) 0.12-0.015 MG/24HR vaginal ring    Sig: Insert vaginally and leave in place for 3 consecutive weeks, then remove for 1 week.    Dispense:  1 each    Refill:  12    Other orders placed at today's visit: No orders of the defined types were placed in this encounter.      Assessment:    Healthy female exam.  Well woman exam with routine gynecological exam - Plan: Cytology - PAP    Plan:    Contraception: NuvaRing vaginal inserts. Follow up in: 1 year.     Return in  about 1 year (around 07/16/2016) for yearly, with Dr Despina HiddenEure.

## 2015-07-18 LAB — CYTOLOGY - PAP

## 2015-12-03 ENCOUNTER — Telehealth: Payer: Self-pay | Admitting: Obstetrics & Gynecology

## 2015-12-03 NOTE — Telephone Encounter (Signed)
Pt states started using the Nuvaring 4-5 months ago, replaces every 4 weeks to skip periods, started having heavy vaginal bleeding with clots with Nuvaring in and due to replace tomorrow. Per Cyril MourningJennifer Griffin, NP pt to take the Nuvaring out x 3 days and replace with a new Nuvaring, if vaginal bleeding persists call our office back for ultrasound. Pt to also take a urine pregnancy test, call our office if positive. Pt verbalized understanding.

## 2016-07-15 ENCOUNTER — Telehealth: Payer: Self-pay | Admitting: *Deleted

## 2016-07-15 NOTE — Telephone Encounter (Signed)
Patient came into the office stating she took her Nuvaring out last night for her "3 hour break" and during the break, the dog chewed it up. She is scheduled for a pap/physical on 5/25 but wants to see if you would refill before then. Please advise.

## 2016-07-16 ENCOUNTER — Other Ambulatory Visit: Payer: Self-pay | Admitting: Obstetrics & Gynecology

## 2016-07-16 ENCOUNTER — Telehealth: Payer: Self-pay | Admitting: *Deleted

## 2016-07-16 MED ORDER — ETONOGESTREL-ETHINYL ESTRADIOL 0.12-0.015 MG/24HR VA RING: VAGINAL_RING | VAGINAL | 12 refills | Status: DC

## 2016-07-16 NOTE — Telephone Encounter (Signed)
Tell her congratulations... Homework, important letter, a check....but a nuva ring First I have heard "the dog ate my nuva ring"

## 2016-07-16 NOTE — Telephone Encounter (Signed)
VM full. No answer at home number. Called to inform Nuva Ring refilled.

## 2016-07-18 ENCOUNTER — Encounter (INDEPENDENT_AMBULATORY_CARE_PROVIDER_SITE_OTHER): Payer: Self-pay

## 2016-07-18 ENCOUNTER — Other Ambulatory Visit (HOSPITAL_COMMUNITY)
Admission: RE | Admit: 2016-07-18 | Discharge: 2016-07-18 | Disposition: A | Discharge status: Home / Self Care | Payer: Medicaid Other | Source: Ambulatory Visit | Attending: Obstetrics & Gynecology | Admitting: Obstetrics & Gynecology

## 2016-07-18 ENCOUNTER — Encounter: Payer: Self-pay | Admitting: Obstetrics & Gynecology

## 2016-07-18 ENCOUNTER — Ambulatory Visit (INDEPENDENT_AMBULATORY_CARE_PROVIDER_SITE_OTHER): Payer: Medicaid Other | Admitting: Obstetrics & Gynecology

## 2016-07-18 VITALS — BP 122/76 | HR 100 | Ht 63.0 in | Wt 142.0 lb

## 2016-07-18 DIAGNOSIS — Z0000 Encounter for general adult medical examination without abnormal findings: Secondary | ICD-10-CM | POA: Diagnosis not present

## 2016-07-18 DIAGNOSIS — Z01419 Encounter for gynecological examination (general) (routine) without abnormal findings: Secondary | ICD-10-CM | POA: Diagnosis not present

## 2016-07-18 DIAGNOSIS — Z124 Encounter for screening for malignant neoplasm of cervix: Secondary | ICD-10-CM

## 2016-07-18 NOTE — Progress Notes (Signed)
Subjective:     Karla Oconnor is a 36 y.o. female here for a routine exam.  Patient's last menstrual period was 07/16/2016 (exact date). [id number] Birth Control Method:  Nuva Ring Menstrual Calendar(currently): amenorrheic does continuous nuva ring  Current complaints: none.   Current acute medical issues:  none   Recent Gynecologic History Patient's last menstrual period was 07/16/2016 (exact date). Last Pap: 2017,  normal Last mammogram: na,    Past Medical History:  Diagnosis Date  . Allergic reaction caused by a drug 03/26/2011   Intravenous Cipro  . Depression 09/27/2012  . Kidney infection   . Kidney stone   . Sciatica   . Scoliosis     Past Surgical History:  Procedure Laterality Date  . CESAREAN SECTION    . CHOLECYSTECTOMY    . DILATION AND CURETTAGE OF UTERUS  09/10/12  . KIDNEY STONE SURGERY      OB History    Gravida Para Term Preterm AB Living   2 1     1 1    SAB TAB Ectopic Multiple Live Births   1       1      Social History   Social History  . Marital status: Single    Spouse name: N/A  . Number of children: N/A  . Years of education: N/A   Social History Main Topics  . Smoking status: Current Every Day Smoker    Packs/day: 1.00    Types: Cigarettes  . Smokeless tobacco: Never Used  . Alcohol use Yes     Comment: occ wine  . Drug use: No  . Sexual activity: Yes    Birth control/ protection: Inserts   Other Topics Concern  . None   Social History Narrative  . None    Family History  Problem Relation Age of Onset  . Diabetes Mother   . Bipolar disorder Mother   . Cancer Maternal Aunt   . Lupus Maternal Aunt   . Cirrhosis Maternal Aunt      Current Outpatient Prescriptions:  .  etonogestrel-ethinyl estradiol (NUVARING) 0.12-0.015 MG/24HR vaginal ring, Insert vaginally and leave in place for 3 consecutive weeks, then remove for 1 week., Disp: 1 each, Rfl: 12  Review of Systems  Review of Systems  Constitutional: Negative for  fever, chills, weight loss, malaise/fatigue and diaphoresis.  HENT: Negative for hearing loss, ear pain, nosebleeds, congestion, sore throat, neck pain, tinnitus and ear discharge.   Eyes: Negative for blurred vision, double vision, photophobia, pain, discharge and redness.  Respiratory: Negative for cough, hemoptysis, sputum production, shortness of breath, wheezing and stridor.   Cardiovascular: Negative for chest pain, palpitations, orthopnea, claudication, leg swelling and PND.  Gastrointestinal: negative for abdominal pain. Negative for heartburn, nausea, vomiting, diarrhea, constipation, blood in stool and melena.  Genitourinary: Negative for dysuria, urgency, frequency, hematuria and flank pain.  Musculoskeletal: Negative for myalgias, back pain, joint pain and falls.  Skin: Negative for itching and rash.  Neurological: Negative for dizziness, tingling, tremors, sensory change, speech change, focal weakness, seizures, loss of consciousness, weakness and headaches.  Endo/Heme/Allergies: Negative for environmental allergies and polydipsia. Does not bruise/bleed easily.  Psychiatric/Behavioral: Negative for depression, suicidal ideas, hallucinations, memory loss and substance abuse. The patient is not nervous/anxious and does not have insomnia.        Objective:  Blood pressure 122/76, pulse 100, height 5\' 3"  (1.6 m), weight 142 lb (64.4 kg), last menstrual period 07/16/2016.   Physical Exam  Vitals reviewed.  Constitutional: She is oriented to person, place, and time. She appears well-developed and well-nourished.  HENT:  Head: Normocephalic and atraumatic.        Right Ear: External ear normal.  Left Ear: External ear normal.  Nose: Nose normal.  Mouth/Throat: Oropharynx is clear and moist.  Eyes: Conjunctivae and EOM are normal. Pupils are equal, round, and reactive to light. Right eye exhibits no discharge. Left eye exhibits no discharge. No scleral icterus.  Neck: Normal range of  motion. Neck supple. No tracheal deviation present. No thyromegaly present.  Cardiovascular: Normal rate, regular rhythm, normal heart sounds and intact distal pulses.  Exam reveals no gallop and no friction rub.   No murmur heard. Respiratory: Effort normal and breath sounds normal. No respiratory distress. She has no wheezes. She has no rales. She exhibits no tenderness.  GI: Soft. Bowel sounds are normal. She exhibits no distension and no mass. There is no tenderness. There is no rebound and no guarding.  Genitourinary:  Breasts no masses skin changes or nipple changes bilaterally      Vulva is normal without lesions Vagina is pink moist without discharge Cervix normal in appearance and pap is done Uterus is normal size shape and contour Adnexa is negative with normal sized ovaries   Musculoskeletal: Normal range of motion. She exhibits no edema and no tenderness.  Neurological: She is alert and oriented to person, place, and time. She has normal reflexes. She displays normal reflexes. No cranial nerve deficit. She exhibits normal muscle tone. Coordination normal.  Skin: Skin is warm and dry. No rash noted. No erythema. No pallor.  Psychiatric: She has a normal mood and affect. Her behavior is normal. Judgment and thought content normal.       Medications Ordered at today's visit: No orders of the defined types were placed in this encounter.   Other orders placed at today's visit: No orders of the defined types were placed in this encounter.     Assessment:    Healthy female exam.    Plan:    continue nuva ring continuously is ok Follow up 1 year     Return in about 1 year (around 07/18/2017) for yearly, with Dr Despina HiddenEure.

## 2016-07-19 ENCOUNTER — Other Ambulatory Visit: Payer: Self-pay | Admitting: Obstetrics & Gynecology

## 2016-07-23 LAB — CYTOLOGY - PAP
Diagnosis: NEGATIVE
HPV (WINDOPATH): NOT DETECTED

## 2017-07-23 ENCOUNTER — Telehealth: Payer: Self-pay | Admitting: Obstetrics & Gynecology

## 2017-07-23 ENCOUNTER — Other Ambulatory Visit: Payer: Self-pay | Admitting: Obstetrics & Gynecology

## 2017-07-27 ENCOUNTER — Telehealth: Payer: Self-pay | Admitting: Obstetrics & Gynecology

## 2017-07-27 NOTE — Telephone Encounter (Signed)
Refill request on Nuvaring.

## 2017-07-27 NOTE — Telephone Encounter (Signed)
It was e prescribed both on 5/28 and earlier today, Is there a problem?

## 2017-07-27 NOTE — Telephone Encounter (Signed)
Informed patient it was sent twice already.

## 2017-08-20 ENCOUNTER — Encounter: Payer: Self-pay | Admitting: Adult Health

## 2017-08-20 ENCOUNTER — Ambulatory Visit: Payer: Medicaid Other | Admitting: Adult Health

## 2017-08-20 VITALS — BP 129/83 | HR 104 | Ht 62.0 in | Wt 140.0 lb

## 2017-08-20 DIAGNOSIS — Z01419 Encounter for gynecological examination (general) (routine) without abnormal findings: Secondary | ICD-10-CM

## 2017-08-20 DIAGNOSIS — Z30011 Encounter for initial prescription of contraceptive pills: Secondary | ICD-10-CM | POA: Insufficient documentation

## 2017-08-20 DIAGNOSIS — Z113 Encounter for screening for infections with a predominantly sexual mode of transmission: Secondary | ICD-10-CM

## 2017-08-20 DIAGNOSIS — F17200 Nicotine dependence, unspecified, uncomplicated: Secondary | ICD-10-CM | POA: Insufficient documentation

## 2017-08-20 DIAGNOSIS — F17210 Nicotine dependence, cigarettes, uncomplicated: Secondary | ICD-10-CM

## 2017-08-20 MED ORDER — NORETHINDRONE 0.35 MG PO TABS: 1.0000 | ORAL_TABLET | Freq: Every day | ORAL | 11 refills | Status: DC

## 2017-08-20 NOTE — Progress Notes (Signed)
Patient ID: Karla Oconnor, female   DOB: 06/24/1980, 37 y.o.   MRN: [id number] History of Present Illness: Karla CosierCrystal is a 37 year old white female, G2P1 in for a well woman gyn exam,she had a normal pap with negative HPV 07/18/16.She is in long standing relationship.  No PCP.    Current Medications, Allergies, Past Medical History, Past Surgical History, Family History and Social History were reviewed in [facility] electronic medical record.     Review of Systems:  Patient denies any headaches, hearing loss, fatigue, blurred vision, shortness of breath, chest pain, abdominal pain, problems with bowel movements, urination, or intercourse. No joint pain or mood swings.   Physical Exam:BP 129/83 (BP Location: Right Arm, Patient Position: Sitting, Cuff Size: Normal)   Pulse (!) 104   Ht 5\' 2"  (1.575 m)   Wt 140 lb (63.5 kg)   BMI 25.61 kg/m  General:  Well developed, well nourished, no acute distress Skin:  Warm and dry Neck:  Midline trachea, normal thyroid, good ROM, no lymphadenopathy Lungs; Clear to auscultation bilaterally Breast:  No dominant palpable mass, retraction, or nipple discharge Cardiovascular: Regular rate and rhythm Abdomen:  Soft, non tender, no hepatosplenomegaly Pelvic:  External genitalia is normal in appearance, no lesions.  The vagina is normal in appearance. Urethra has no lesions or masses. The cervix is bulbous.GC/CHL obtained.Nuva ring removed.  Uterus is felt to be normal size, shape, and contour.  No adnexal masses or tenderness noted.Bladder is non tender, no masses felt. Extremities/musculoskeletal:  No swelling or varicosities noted, no clubbing or cyanosis Psych:  No mood changes, alert and cooperative,seems happy PHQ 2 score 0. She declines to stop smoking, so stop nuva ring, since over 35 and start Micronor, and sign tubal papers, but review handouts on tubal ligation,ablation and IUD, all discussed with her.She thinks she wants the tubal but will  discuss with partner, start Micronor tonight and use condoms for 1 pack.  Impression: 1. Encounter for well woman exam with routine gynecological exam   2. Encounter for initial prescription of contraceptive pills   3. Screening examination for STD (sexually transmitted disease)   4. Smoker       Plan: GC/CHL sent  Meds ordered this encounter  Medications  . norethindrone (MICRONOR,CAMILA,ERRIN) 0.35 MG tablet    Sig: Take 1 tablet (0.35 mg total) by mouth daily.    Dispense:  1 Package    Refill:  11    Order Specific Question:   Supervising Provider    Answer:   Lazaro ArmsEURE, LUTHER H [[id number]]  Start Micronor tonight Use condoms Tubal papers signed Review handouts on tubal ligation, and ablation and IUD Physical in 1 year Pap in 2021

## 2017-08-24 LAB — GC/CHLAMYDIA PROBE AMP
CHLAMYDIA, DNA PROBE: NEGATIVE
NEISSERIA GONORRHOEAE BY PCR: NEGATIVE

## 2017-09-21 ENCOUNTER — Encounter: Payer: Medicaid Other | Admitting: Obstetrics & Gynecology

## 2017-09-29 ENCOUNTER — Encounter: Payer: Self-pay | Admitting: Obstetrics & Gynecology

## 2017-09-29 ENCOUNTER — Other Ambulatory Visit: Payer: Self-pay

## 2017-09-29 ENCOUNTER — Ambulatory Visit: Payer: Medicaid Other | Admitting: Obstetrics & Gynecology

## 2017-09-29 VITALS — BP 138/92 | HR 97 | Ht 63.0 in | Wt 141.0 lb

## 2017-09-29 DIAGNOSIS — N946 Dysmenorrhea, unspecified: Secondary | ICD-10-CM | POA: Diagnosis not present

## 2017-09-29 DIAGNOSIS — Z3009 Encounter for other general counseling and advice on contraception: Secondary | ICD-10-CM

## 2017-09-29 DIAGNOSIS — Z01818 Encounter for other preprocedural examination: Secondary | ICD-10-CM | POA: Diagnosis not present

## 2017-09-29 DIAGNOSIS — N921 Excessive and frequent menstruation with irregular cycle: Secondary | ICD-10-CM

## 2017-09-29 NOTE — Progress Notes (Signed)
Preoperative History and Physical  Karla Oconnor is a 37 y.o. G2P1011 with Patient's last menstrual period was 09/11/2017 (approximate). admitted for a laparoscopic bilateral tubal ligation using electrocautery and hysteroscopy uterine curettage Minerva endometrial ablation.  Pt wants permanent sterilization She has been using continuous Nuva ring for cycle control but when she has her own periods she has very heavy bleeding, clots wears double pads and uses towels at night As a result will proceed with endometrial ablation  PMH:    Past Medical History:  Diagnosis Date  . Allergic reaction caused by a drug 03/26/2011   Intravenous Cipro  . Depression 09/27/2012  . Kidney infection   . Kidney stone   . Sciatica   . Scoliosis     PSH:     Past Surgical History:  Procedure Laterality Date  . CESAREAN SECTION    . CHOLECYSTECTOMY    . DILATION AND CURETTAGE OF UTERUS  09/10/12  . KIDNEY STONE SURGERY      POb/GynH:      OB History    Gravida  2   Para  1   Term  1   Preterm      AB  1   Living  1     SAB  1   TAB      Ectopic      Multiple      Live Births  1           SH:   Social History   Tobacco Use  . Smoking status: Current Every Day Smoker    Packs/day: 1.00    Years: 20.00    Pack years: 20.00    Types: Cigarettes  . Smokeless tobacco: Never Used  Substance Use Topics  . Alcohol use: Yes    Comment: occ wine  . Drug use: No    FH:    Family History  Problem Relation Age of Onset  . Diabetes Mother   . Bipolar disorder Mother   . Cancer Maternal Aunt   . Lupus Maternal Aunt   . Cirrhosis Maternal Aunt      Allergies:  Allergies  Allergen Reactions  . Ciprofloxacin In D5w     RASH AND BURNING AT IV SITE.  Marland Kitchen Codeine Hives  . Nubain [Nalbuphine Hcl]   . Sulfa Antibiotics   . Toradol [Ketorolac Tromethamine]     Medications:       Current Outpatient Medications:  .  norethindrone (MICRONOR,CAMILA,ERRIN) 0.35 MG tablet,  Take 1 tablet (0.35 mg total) by mouth daily., Disp: 1 Package, Rfl: 11  Review of Systems:   Review of Systems  Constitutional: Negative for fever, chills, weight loss, malaise/fatigue and diaphoresis.  HENT: Negative for hearing loss, ear pain, nosebleeds, congestion, sore throat, neck pain, tinnitus and ear discharge.   Eyes: Negative for blurred vision, double vision, photophobia, pain, discharge and redness.  Respiratory: Negative for cough, hemoptysis, sputum production, shortness of breath, wheezing and stridor.   Cardiovascular: Negative for chest pain, palpitations, orthopnea, claudication, leg swelling and PND.  Gastrointestinal: Positive for abdominal pain. Negative for heartburn, nausea, vomiting, diarrhea, constipation, blood in stool and melena.  Genitourinary: Negative for dysuria, urgency, frequency, hematuria and flank pain.  Musculoskeletal: Negative for myalgias, back pain, joint pain and falls.  Skin: Negative for itching and rash.  Neurological: Negative for dizziness, tingling, tremors, sensory change, speech change, focal weakness, seizures, loss of consciousness, weakness and headaches.  Endo/Heme/Allergies: Negative for environmental allergies and polydipsia. Does not  bruise/bleed easily.  Psychiatric/Behavioral: Negative for depression, suicidal ideas, hallucinations, memory loss and substance abuse. The patient is not nervous/anxious and does not have insomnia.      PHYSICAL EXAM:  Blood pressure (!) 138/92, pulse 97, height 5\' 3"  (1.6 m), weight 141 lb (64 kg), last menstrual period 09/11/2017.    Vitals reviewed. Constitutional: She is oriented to person, place, and time. She appears well-developed and well-nourished.  HENT:  Head: Normocephalic and atraumatic.  Right Ear: External ear normal.  Left Ear: External ear normal.  Nose: Nose normal.  Mouth/Throat: Oropharynx is clear and moist.  Eyes: Conjunctivae and EOM are normal. Pupils are equal, round,  and reactive to light. Right eye exhibits no discharge. Left eye exhibits no discharge. No scleral icterus.  Neck: Normal range of motion. Neck supple. No tracheal deviation present. No thyromegaly present.  Cardiovascular: Normal rate, regular rhythm, normal heart sounds and intact distal pulses.  Exam reveals no gallop and no friction rub.   No murmur heard. Respiratory: Effort normal and breath sounds normal. No respiratory distress. She has no wheezes. She has no rales. She exhibits no tenderness.  GI: Soft. Bowel sounds are normal. She exhibits no distension and no mass. There is tenderness. There is no rebound and no guarding.  Genitourinary:       Vulva is normal without lesions Vagina is pink moist without discharge Cervix normal in appearance and pap is normal Uterus is normal size, contour, position, consistency, mobility, non-tender Adnexa is negative with normal sized ovaries by sonogram  Musculoskeletal: Normal range of motion. She exhibits no edema and no tenderness.  Neurological: She is alert and oriented to person, place, and time. She has normal reflexes. She displays normal reflexes. No cranial nerve deficit. She exhibits normal muscle tone. Coordination normal.  Skin: Skin is warm and dry. No rash noted. No erythema. No pallor.  Psychiatric: She has a normal mood and affect. Her behavior is normal. Judgment and thought content normal.    Labs: No results found for this or any previous visit (from the past 336 hour(s)).  EKG: No orders found for this or any previous visit.  Imaging Studies: No results found.    Assessment: Parous female desires permanent sterilization Menometrorrhagia Dysmenorrhea   Plan: Laparoscopic bilateral tubal ligation using electrocautery + Hysteroscopy uterine curettage minerva endometrial ablation  Lazaro ArmsLuther H Safwan Tomei 09/29/2017 4:36 PM  .   Face to face time:  15 minutes  Greater than 50% of the visit time was spent in counseling  and coordination of care with the patient.  The summary and outline of the counseling and care coordination is summarized in the note above.   All questions were answered.

## 2017-10-05 ENCOUNTER — Other Ambulatory Visit: Payer: Self-pay | Admitting: Obstetrics & Gynecology

## 2017-10-05 NOTE — Patient Instructions (Signed)
Karla Oconnor  10/05/2017     @PREFPERIOPPHARMACY @   Your procedure is scheduled on  10/14/2017.  Report to [facility] at  920   A.M.  Call this number if you have problems the morning of surgery:  [phone]   Remember:  Do not eat or drink after midnight.  You may drink clear liquids until  12 midnight 10/13/2017 .  Clear liquids allowed are:                    Water, Juice (non-citric and without pulp), Carbonated beverages, Clear Tea, Black Coffee only, Plain Jell-O only, Gatorade and Plain Popsicles only    Take these medicines the morning of surgery with A SIP OF WATER  None    Do not wear jewelry, make-up or nail polish.  Do not wear lotions, powders, or perfumes, or deodorant.  Do not shave 48 hours prior to surgery.  Men may shave face and neck.  Do not bring valuables to the hospital.  [facility] is not responsible for any belongings or valuables.  Contacts, dentures or bridgework may not be worn into surgery.  Leave your suitcase in the car.  After surgery it may be brought to your room.  For patients admitted to the hospital, discharge time will be determined by your treatment team.  Patients discharged the day of surgery will not be allowed to drive home.   Name and phone number of your driver:   family Special instructions:  None  Please read over the following fact sheets that you were given. Anesthesia Post-op Instructions and Care and Recovery After Surgery       Laparoscopic Tubal Ligation Laparoscopic tubal ligation is a procedure to close the fallopian tubes. This is done so that you cannot get pregnant. When the fallopian tubes are closed, the eggs that your ovaries release cannot enter the uterus, and sperm cannot reach the released eggs. A laparoscopic tubal ligation is sometimes called "getting your tubes tied." You should not have this procedure if you want to get pregnant someday or if you are unsure about  having more children. Tell a health care provider about:  Any allergies you have.  All medicines you are taking, including vitamins, herbs, eye drops, creams, and over-the-counter medicines.  Any problems you or family members have had with anesthetic medicines.  Any blood disorders you have.  Any surgeries you have had.  Any medical conditions you have.  Whether you are pregnant or may be pregnant.  Any past pregnancies. What are the risks? Generally, this is a safe procedure. However, problems may occur, including:  Infection.  Bleeding.  Injury to surrounding organs.  Side effects from anesthetics.  Failure of the procedure.  This procedure can increase your risk of a kind of pregnancy in which a fertilized egg attaches to the outside of the uterus (ectopic pregnancy). What happens before the procedure?  Ask your health care provider about: ? Changing or stopping your regular medicines. This is especially important if you are taking diabetes medicines or blood thinners. ? Taking medicines such as aspirin and ibuprofen. These medicines can thin your blood. Do not take these medicines before your procedure if your health care provider instructs you not to.  Follow instructions from your health care provider about eating and drinking restrictions.  Plan to have someone take you home after  the procedure.  If you go home right after the procedure, plan to have someone with you for 24 hours. What happens during the procedure?  You will be given one or more of the following: ? A medicine to help you relax (sedative). ? A medicine to numb the area (local anesthetic). ? A medicine to make you fall asleep (general anesthetic). ? A medicine that is injected into an area of your body to numb everything below the injection site (regional anesthetic).  An IV tube will be inserted into one of your veins. It will be used to give you medicines and fluids during the  procedure.  Your bladder may be emptied with a small tube (catheter).  If you have been given a general anesthetic, a tube will be put down your throat to help you breathe.  Two small cuts (incisions) will be made in your lower abdomen and near your belly button.  Your abdomen will be inflated with a gas. This will let the surgeon see better and will give the surgeon room to work.  A thin, lighted tube (laparoscope) with a camera attached will be inserted into your abdomen through one of the incisions. Small instruments will be inserted through the other incision.  The fallopian tubes will be tied off, burned (cauterized), or blocked with a clip, ring, or clamp. A small portion in the center of each fallopian tube may be removed.  The gas will be released from the abdomen.  The incisions will be closed with stitches (sutures).  A bandage (dressing) will be placed over the incisions. The procedure may vary among health care providers and hospitals. What happens after the procedure?  Your blood pressure, heart rate, breathing rate, and blood oxygen level will be monitored often until the medicines you were given have worn off.  You will be given medicine to help with pain, nausea, and vomiting as needed. This information is not intended to replace advice given to you by your health care provider. Make sure you discuss any questions you have with your health care provider. Document Released: 05/19/2000 Document Revised: 07/19/2015 Document Reviewed: 01/21/2015 Elsevier Interactive Patient Education  2018 [employer]  Laparoscopic Tubal Ligation, Care After Refer to this sheet in the next few weeks. These instructions provide you with information about caring for yourself after your procedure. Your health care provider may also give you more specific instructions. Your treatment has been planned according to current medical practices, but problems sometimes occur. Call your health care  provider if you have any problems or questions after your procedure. What can I expect after the procedure? After the procedure, it is common to have:  A sore throat.  Discomfort in your shoulder.  Mild discomfort or cramping in your abdomen.  Gas pains.  Pain or soreness in the area where the surgical cut (incision) was made.  A bloated feeling.  Tiredness.  Nausea.  Vomiting.  Follow these instructions at home: Medicines  Take over-the-counter and prescription medicines only as told by your health care provider.  Do not take aspirin because it can cause bleeding.  Do not drive or operate heavy machinery while taking prescription pain medicine. Activity  Rest for the rest of the day.  Return to your normal activities as told by your health care provider. Ask your health care provider what activities are safe for you. Incision care   Follow instructions from your health care provider about how to take care of your incision. Make sure you: ?  Wash your hands with soap and water before you change your bandage (dressing). If soap and water are not available, use hand sanitizer. ? Change your dressing as told by your health care provider. ? Leave stitches (sutures) in place. They may need to stay in place for 2 weeks or longer.  Check your incision area every day for signs of infection. Check for: ? More redness, swelling, or pain. ? More fluid or blood. ? Warmth. ? Pus or a bad smell. Other Instructions  Do not take baths, swim, or use a hot tub until your health care provider approves. You may take showers.  Keep all follow-up visits as told by your health care provider. This is important.  Have someone help you with your daily household tasks for the first few days. Contact a health care provider if:  You have more redness, swelling, or pain around your incision.  Your incision feels warm to the touch.  You have pus or a bad smell coming from your  incision.  The edges of your incision break open after the sutures have been removed.  Your pain does not improve after 2-3 days.  You have a rash.  You repeatedly become dizzy or light-headed.  Your pain medicine is not helping.  You are constipated. Get help right away if:  You have a fever.  You faint.  You have increasing pain in your abdomen.  You have severe pain in one or both of your shoulders.  You have fluid or blood coming from your sutures or from your vagina.  You have shortness of breath or difficulty breathing.  You have chest pain or leg pain.  You have ongoing nausea, vomiting, or diarrhea. This information is not intended to replace advice given to you by your health care provider. Make sure you discuss any questions you have with your health care provider. Document Released: 08/30/2004 Document Revised: 07/16/2015 Document Reviewed: 01/21/2015 Elsevier Interactive Patient Education  [employer]. Hysteroscopy Hysteroscopy is a procedure used for looking inside the womb (uterus). It may be done for various reasons, including:  To evaluate abnormal bleeding, fibroid (benign, noncancerous) tumors, polyps, scar tissue (adhesions), and possibly cancer of the uterus.  To look for lumps (tumors) and other uterine growths.  To look for causes of why a woman cannot get pregnant (infertility), causes of recurrent loss of pregnancy (miscarriages), or a lost intrauterine device (IUD).  To perform a sterilization by blocking the fallopian tubes from inside the uterus.  In this procedure, a thin, flexible tube with a tiny light and camera on the end of it (hysteroscope) is used to look inside the uterus. A hysteroscopy should be done right after a menstrual period to be sure you are not pregnant. LET [facility] CARE PROVIDER KNOW ABOUT:  Any allergies you have.  All medicines you are taking, including vitamins, herbs, eye drops, creams, and  over-the-counter medicines.  Previous problems you or members of your family have had with the use of anesthetics.  Any blood disorders you have.  Previous surgeries you have had.  Medical conditions you have. RISKS AND COMPLICATIONS Generally, this is a safe procedure. However, as with any procedure, complications can occur. Possible complications include:  Putting a hole in the uterus.  Excessive bleeding.  Infection.  Damage to the cervix.  Injury to other organs.  Allergic reaction to medicines.  Too much fluid used in the uterus for the procedure.  BEFORE THE PROCEDURE  Ask your health care  provider about changing or stopping any regular medicines.  Do not take aspirin or blood thinners for 1 week before the procedure, or as directed by your health care provider. These can cause bleeding.  If you smoke, do not smoke for 2 weeks before the procedure.  In some cases, a medicine is placed in the cervix the day before the procedure. This medicine makes the cervix have a larger opening (dilate). This makes it easier for the instrument to be inserted into the uterus during the procedure.  Do not eat or drink anything for at least 8 hours before the surgery.  Arrange for someone to take you home after the procedure. PROCEDURE  You may be given a medicine to relax you (sedative). You may also be given one of the following: ? A medicine that numbs the area around the cervix (local anesthetic). ? A medicine that makes you sleep through the procedure (general anesthetic).  The hysteroscope is inserted through the vagina into the uterus. The camera on the hysteroscope sends a picture to a TV screen. This gives the surgeon a good view inside the uterus.  During the procedure, air or a liquid is put into the uterus, which allows the surgeon to see better.  Sometimes, tissue is gently scraped from inside the uterus. These tissue samples are sent to a lab for testing. What to  expect after the procedure  If you had a general anesthetic, you may be groggy for a couple hours after the procedure.  If you had a local anesthetic, you will be able to go home as soon as you are stable and feel ready.  You may have some cramping. This normally lasts for a couple days.  You may have bleeding, which varies from light spotting for a few days to menstrual-like bleeding for 3-7 days. This is normal.  If your test results are not back during the visit, make an appointment with your health care provider to find out the results. This information is not intended to replace advice given to you by your health care provider. Make sure you discuss any questions you have with your health care provider. Document Released: 05/19/2000 Document Revised: 07/19/2015 Document Reviewed: 09/09/2012 Elsevier Interactive Patient Education  2017 [employer] Inc. Hysteroscopy, Care After Refer to this sheet in the next few weeks. These instructions provide you with information on caring for yourself after your procedure. Your health care provider may also give you more specific instructions. Your treatment has been planned according to current medical practices, but problems sometimes occur. Call your health care provider if you have any problems or questions after your procedure. What can I expect after the procedure? After your procedure, it is typical to have the following:  You may have some cramping. This normally lasts for a couple days.  You may have bleeding. This can vary from light spotting for a few days to menstrual-like bleeding for 3-7 days.  Follow these instructions at home:  Rest for the first 1-2 days after the procedure.  Only take over-the-counter or prescription medicines as directed by your health care provider. Do not take aspirin. It can increase the chances of bleeding.  Take showers instead of baths for 2 weeks or as directed by your health care provider.  Do not drive  for 24 hours or as directed.  Do not drink alcohol while taking pain medicine.  Do not use tampons, douche, or have sexual intercourse for 2 weeks or until your health care provider  says it is okay.  Take your temperature twice a day for 4-5 days. Write it down each time.  Follow your health care provider's advice about diet, exercise, and lifting.  If you develop constipation, you may: ? Take a mild laxative if your health care provider approves. ? Add bran foods to your diet. ? Drink enough fluids to keep your urine clear or pale yellow.  Try to have someone with you or available to you for the first 24-48 hours, especially if you were given a general anesthetic.  Follow up with your health care provider as directed. Contact a health care provider if:  You feel dizzy or lightheaded.  You feel sick to your stomach (nauseous).  You have abnormal vaginal discharge.  You have a rash.  You have pain that is not controlled with medicine. Get help right away if:  You have bleeding that is heavier than a normal menstrual period.  You have a fever.  You have increasing cramps or pain, not controlled with medicine.  You have new belly (abdominal) pain.  You pass out.  You have pain in the tops of your shoulders (shoulder strap areas).  You have shortness of breath. This information is not intended to replace advice given to you by your health care provider. Make sure you discuss any questions you have with your health care provider. Document Released: 12/01/2012 Document Revised: 07/19/2015 Document Reviewed: 09/09/2012 Elsevier Interactive Patient Education  2017 Elsevier Inc.  Dilation and Curettage or Vacuum Curettage Dilation and curettage (D&C) and vacuum curettage are minor procedures. A D&C involves stretching (dilation) the cervix and scraping (curettage) the inside lining of the uterus (endometrium). During a D&C, tissue is gently scraped from the endometrium,  starting from the top portion of the uterus down to the lowest part of the uterus (cervix). During a vacuum curettage, the lining and tissue in the uterus are removed with the use of gentle suction. Curettage may be performed to either diagnose or treat a problem. As a diagnostic procedure, curettage is performed to examine tissues from the uterus. A diagnostic curettage may be done if you have:  Irregular bleeding in the uterus.  Bleeding with the development of clots.  Spotting between menstrual periods.  Prolonged menstrual periods or other abnormal bleeding.  Bleeding after menopause.  No menstrual period (amenorrhea).  A change in size and shape of the uterus.  Abnormal endometrial cells discovered during a Pap test.  As a treatment procedure, curettage may be performed for the following reasons:  Removal of an IUD (intrauterine device).  Removal of retained placenta after giving birth.  Abortion.  Miscarriage.  Removal of endometrial polyps.  Removal of uncommon types of noncancerous lumps (fibroids).  Tell a health care provider about:  Any allergies you have, including allergies to prescribed medicine or latex.  All medicines you are taking, including vitamins, herbs, eye drops, creams, and over-the-counter medicines. This is especially important if you take any blood-thinning medicine. Bring a list of all of your medicines to your appointment.  Any problems you or family members have had with anesthetic medicines.  Any blood disorders you have.  Any surgeries you have had.  Your medical history and any medical conditions you have.  Whether you are pregnant or may be pregnant.  Recent vaginal infections you have had.  Recent menstrual periods, bleeding problems you have had, and what form of birth control (contraception) you use. What are the risks? Generally, this is a safe procedure. However,  problems may occur, including:  Infection.  Heavy vaginal  bleeding.  Allergic reactions to medicines.  Damage to the cervix or other structures or organs.  Development of scar tissue (adhesions) inside the uterus, which can cause abnormal amounts of menstrual bleeding. This may make it harder to get pregnant in the future.  A hole (perforation) or puncture in the uterine wall. This is rare.  What happens before the procedure? Staying hydrated Follow instructions from your health care provider about hydration, which may include:  Up to 2 hours before the procedure - you may continue to drink clear liquids, such as water, clear fruit juice, black coffee, and plain tea.  Eating and drinking restrictions Follow instructions from your health care provider about eating and drinking, which may include:  8 hours before the procedure - stop eating heavy meals or foods such as meat, fried foods, or fatty foods.  6 hours before the procedure - stop eating light meals or foods, such as toast or cereal.  6 hours before the procedure - stop drinking milk or drinks that contain milk.  2 hours before the procedure - stop drinking clear liquids. If your health care provider told you to take your medicine(s) on the day of your procedure, take them with only a sip of water.  Medicines  Ask your health care provider about: ? Changing or stopping your regular medicines. This is especially important if you are taking diabetes medicines or blood thinners. ? Taking medicines such as aspirin and ibuprofen. These medicines can thin your blood. Do not take these medicines before your procedure if your health care provider instructs you not to.  You may be given antibiotic medicine to help prevent infection. General instructions  For 24 hours before your procedure, do not: ? Douche. ? Use tampons. ? Use medicines, creams, or suppositories in the vagina. ? Have sexual intercourse.  You may be given a pregnancy test on the day of the procedure.  Plan to have  someone take you home from the hospital or clinic.  You may have a blood or urine sample taken.  If you will be going home right after the procedure, plan to have someone with you for 24 hours. What happens during the procedure?  To reduce your risk of infection: ? Your health care team will wash or sanitize their hands. ? Your skin will be washed with soap.  An IV tube will be inserted into one of your veins.  You will be given one of the following: ? A medicine that numbs the area in and around the cervix (local anesthetic). ? A medicine to make you fall asleep (general anesthetic).  You will lie down on your back, with your feet in foot rests (stirrups).  The size and position of your uterus will be checked.  A lubricated instrument (speculum or Sims retractor) will be inserted into the back side of your vagina. The speculum will be used to hold apart the walls of your vagina so your health care provider can see your cervix.  A tool (tenaculum) will be attached to the lip of the cervix to stabilize it.  Your cervix will be softened and dilated. This may be done by: ? Taking a medicine. ? Having tapered dilators or thin rods (laminaria) or gradual widening instruments (tapered dilators) inserted into your cervix.  A small, sharp, curved instrument (curette) will be used to scrape a small amount of tissue or cells from the endometrium or cervical canal. In  some cases, gentle suction is applied with the curette. The curette will then be removed. The cells will be taken to a lab for testing. The procedure may vary among health care providers and hospitals. What happens after the procedure?  You may have mild cramping, backache, pain, and light bleeding or spotting. You may pass small blood clots from your vagina.  You may have to wear compression stockings. These stockings help to prevent blood clots and reduce swelling in your legs.  Your blood pressure, heart rate, breathing  rate, and blood oxygen level will be monitored until the medicines you were given have worn off. Summary  Dilation and curettage (D&C) involves stretching (dilation) the cervix and scraping (curettage) the inside lining of the uterus (endometrium).  After the procedure, you may have mild cramping, backache, pain, and light bleeding or spotting. You may pass small blood clots from your vagina.  Plan to have someone take you home from the hospital or clinic. This information is not intended to replace advice given to you by your health care provider. Make sure you discuss any questions you have with your health care provider. Document Released: 02/10/2005 Document Revised: 10/28/2015 Document Reviewed: 10/28/2015 Elsevier Interactive Patient Education  2018 [employer].  Dilation and Curettage or Vacuum Curettage, Care After These instructions give you information about caring for yourself after your procedure. Your doctor may also give you more specific instructions. Call your doctor if you have any problems or questions after your procedure. Follow these instructions at home: Activity  Do not drive or use heavy machinery while taking prescription pain medicine.  For 24 hours after your procedure, avoid driving.  Take short walks often, followed by rest periods. Ask your doctor what activities are safe for you. After one or two days, you may be able to return to your normal activities.  Do not lift anything that is heavier than 10 lb (4.5 kg) until your doctor approves.  For at least 2 weeks, or as long as told by your doctor: ? Do not douche. ? Do not use tampons. ? Do not have sex. General instructions  Take over-the-counter and prescription medicines only as told by your doctor. This is very important if you take blood thinning medicine.  Do not take baths, swim, or use a hot tub until your doctor approves. Take showers instead of baths.  Wear compression stockings as told by  your doctor.  It is up to you to get the results of your procedure. Ask your doctor when your results will be ready.  Keep all follow-up visits as told by your doctor. This is important. Contact a doctor if:  You have very bad cramps that get worse or do not get better with medicine.  You have very bad pain in your belly (abdomen).  You cannot drink fluids without throwing up (vomiting).  You get pain in a different part of the area between your belly and thighs (pelvis).  You have bad-smelling discharge from your vagina.  You have a rash. Get help right away if:  You are bleeding a lot from your vagina. A lot of bleeding means soaking more than one sanitary pad in an hour, for 2 hours in a row.  You have clumps of blood (blood clots) coming from your vagina.  You have a fever or chills.  Your belly feels very tender or hard.  You have chest pain.  You have trouble breathing.  You cough up blood.  You feel dizzy.  You feel light-headed.  You pass out (faint).  You have pain in your neck or shoulder area. Summary  Take short walks often, followed by rest periods. Ask your doctor what activities are safe for you. After one or two days, you may be able to return to your normal activities.  Do not lift anything that is heavier than 10 lb (4.5 kg) until your doctor approves.  Do not take baths, swim, or use a hot tub until your doctor approves. Take showers instead of baths.  Contact your doctor if you have any symptoms of infection, like bad-smelling discharge from your vagina. This information is not intended to replace advice given to you by your health care provider. Make sure you discuss any questions you have with your health care provider. Document Released: 11/20/2007 Document Revised: 10/29/2015 Document Reviewed: 10/29/2015 Elsevier Interactive Patient Education  2017 Else[employer] Inc.  Endometrial Ablation Endometrial ablation is a procedure that destroys the  thin inner layer of the lining of the uterus (endometrium). This procedure may be done:  To stop heavy periods.  To stop bleeding that is causing anemia.  To control irregular bleeding.  To treat bleeding caused by small tumors (fibroids) in the endometrium.  This procedure is often an alternative to major surgery, such as removal of the uterus and cervix (hysterectomy). As a result of this procedure:  You may not be able to have children. However, if you are premenopausal (you have not gone through menopause): ? You may still have a small chance of getting pregnant. ? You will need to use a reliable method of birth control after the procedure to prevent pregnancy.  You may stop having a menstrual period, or you may have only a small amount of bleeding during your period. Menstruation may return several years after the procedure.  Tell a health care provider about:  Any allergies you have.  All medicines you are taking, including vitamins, herbs, eye drops, creams, and over-the-counter medicines.  Any problems you or family members have had with the use of anesthetic medicines.  Any blood disorders you have.  Any surgeries you have had.  Any medical conditions you have. What are the risks? Generally, this is a safe procedure. However, problems may occur, including:  A hole (perforation) in the uterus or bowel.  Infection of the uterus, bladder, or vagina.  Bleeding.  Damage to other structures or organs.  An air bubble in the lung (air embolus).  Problems with pregnancy after the procedure.  Failure of the procedure.  Decreased ability to diagnose cancer in the endometrium.  What happens before the procedure?  You will have tests of your endometrium to make sure there are no pre-cancerous cells or cancer cells present.  You may have an ultrasound of the uterus.  You may be given medicines to thin the endometrium.  Ask your health care provider  about: ? Changing or stopping your regular medicines. This is especially important if you take diabetes medicines or blood thinners. ? Taking medicines such as aspirin and ibuprofen. These medicines can thin your blood. Do not take these medicines before your procedure if your doctor tells you not to.  Plan to have someone take you home from the hospital or clinic. What happens during the procedure?  You will lie on an exam table with your feet and legs supported as in a pelvic exam.  To lower your risk of infection: ? Your health care team will wash or sanitize their hands and  put on germ-free (sterile) gloves. ? Your genital area will be washed with soap.  An IV tube will be inserted into one of your veins.  You will be given a medicine to help you relax (sedative).  A surgical instrument with a light and camera (resectoscope) will be inserted into your vagina and moved into your uterus. This allows your surgeon to see inside your uterus.  Endometrial tissue will be removed using one of the following methods: ? Radiofrequency. This method uses a radiofrequency-alternating electric current to remove the endometrium. ? Cryotherapy. This method uses extreme cold to freeze the endometrium. ? Heated-free liquid. This method uses a heated saltwater (saline) solution to remove the endometrium. ? Microwave. This method uses high-energy microwaves to heat up the endometrium and remove it. ? Thermal balloon. This method involves inserting a catheter with a balloon tip into the uterus. The balloon tip is filled with heated fluid to remove the endometrium. The procedure may vary among health care providers and hospitals. What happens after the procedure?  Your blood pressure, heart rate, breathing rate, and blood oxygen level will be monitored until the medicines you were given have worn off.  As tissue healing occurs, you may notice vaginal bleeding for 4-6 weeks after the procedure. You may  also experience: ? Cramps. ? Thin, watery vaginal discharge that is light pink or brown in color. ? A need to urinate more frequently than usual. ? Nausea.  Do not drive for 24 hours if you were given a sedative.  Do not have sex or insert anything into your vagina until your health care provider approves. Summary  Endometrial ablation is done to treat the many causes of heavy menstrual bleeding.  The procedure may be done only after medications have been tried to control the bleeding.  Plan to have someone take you home from the hospital or clinic. This information is not intended to replace advice given to you by your health care provider. Make sure you discuss any questions you have with your health care provider. Document Released: 12/21/2003 Document Revised: 02/28/2016 Document Reviewed: 02/28/2016 Elsevier Interactive Patient Education  2017 [employer] Inc.  General Anesthesia, Adult General anesthesia is the use of medicines to make a person "go to sleep" (be unconscious) for a medical procedure. General anesthesia is often recommended when a procedure:  Is long.  Requires you to be still or in an unusual position.  Is major and can cause you to lose blood.  Is impossible to do without general anesthesia.  The medicines used for general anesthesia are called general anesthetics. In addition to making you sleep, the medicines:  Prevent pain.  Control your blood pressure.  Relax your muscles.  Tell a health care provider about:  Any allergies you have.  All medicines you are taking, including vitamins, herbs, eye drops, creams, and over-the-counter medicines.  Any problems you or family members have had with anesthetic medicines.  Types of anesthetics you have had in the past.  Any bleeding disorders you have.  Any surgeries you have had.  Any medical conditions you have.  Any history of heart or lung conditions, such as heart failure, sleep apnea, or  chronic obstructive pulmonary disease (COPD).  Whether you are pregnant or may be pregnant.  Whether you use tobacco, alcohol, marijuana, or street drugs.  Any history of [employer].  Any history of depression or anxiety. What are the risks? Generally, this is a safe procedure. However, problems may occur, including:  Allergic  reaction to anesthetics.  Lung and heart problems.  Inhaling food or liquids from your stomach into your lungs (aspiration).  Injury to nerves.  Waking up during your procedure and being unable to move (rare).  Extreme agitation or a state of mental confusion (delirium) when you wake up from the anesthetic.  Air in the bloodstream, which can lead to stroke.  These problems are more likely to develop if you are having a major surgery or if you have an advanced medical condition. You can prevent some of these complications by answering all of your health care provider's questions thoroughly and by following all pre-procedure instructions. General anesthesia can cause side effects, including:  Nausea or vomiting  A sore throat from the breathing tube.  Feeling cold or shivery.  Feeling tired, washed out, or achy.  Sleepiness or drowsiness.  Confusion or agitation.  What happens before the procedure? Staying hydrated Follow instructions from your health care provider about hydration, which may include:  Up to 2 hours before the procedure - you may continue to drink clear liquids, such as water, clear fruit juice, black coffee, and plain tea.  Eating and drinking restrictions Follow instructions from your health care provider about eating and drinking, which may include:  8 hours before the procedure - stop eating heavy meals or foods such as meat, fried foods, or fatty foods.  6 hours before the procedure - stop eating light meals or foods, such as toast or cereal.  6 hours before the procedure - stop drinking milk or drinks that contain  milk.  2 hours before the procedure - stop drinking clear liquids.  Medicines  Ask your health care provider about: ? Changing or stopping your regular medicines. This is especially important if you are taking diabetes medicines or blood thinners. ? Taking medicines such as aspirin and ibuprofen. These medicines can thin your blood. Do not take these medicines before your procedure if your health care provider instructs you not to. ? Taking new dietary supplements or medicines. Do not take these during the week before your procedure unless your health care provider approves them.  If you are told to take a medicine or to continue taking a medicine on the day of the procedure, take the medicine with sips of water. General instructions   Ask if you will be going home the same day, the following day, or after a longer hospital stay. ? Plan to have someone take you home. ? Plan to have someone stay with you for the first 24 hours after you leave the hospital or clinic.  For 3-6 weeks before the procedure, try not to use any tobacco products, such as cigarettes, chewing tobacco, and e-cigarettes.  You may brush your teeth on the morning of the procedure, but make sure to spit out the toothpaste. What happens during the procedure?  You will be given anesthetics through a mask and through an IV tube in one of your veins.  You may receive medicine to help you relax (sedative).  As soon as you are asleep, a breathing tube may be used to help you breathe.  An anesthesia specialist will stay with you throughout the procedure. He or she will help keep you comfortable and safe by continuing to give you medicines and adjusting the amount of medicine that you get. He or she will also watch your blood pressure, pulse, and oxygen levels to make sure that the anesthetics do not cause any problems.  If a breathing tube  was used to help you breathe, it will be removed before you wake up. The procedure  may vary among health care providers and hospitals. What happens after the procedure?  You will wake up, often slowly, after the procedure is complete, usually in a recovery area.  Your blood pressure, heart rate, breathing rate, and blood oxygen level will be monitored until the medicines you were given have worn off.  You may be given medicine to help you calm down if you feel anxious or agitated.  If you will be going home the same day, your health care provider may check to make sure you can stand, drink, and urinate.  Your health care providers will treat your pain and side effects before you go home.  Do not drive for 24 hours if you received a sedative.  You may: ? Feel nauseous and vomit. ? Have a sore throat. ? Have mental slowness. ? Feel cold or shivery. ? Feel sleepy. ? Feel tired. ? Feel sore or achy, even in parts of your body where you did not have surgery. This information is not intended to replace advice given to you by your health care provider. Make sure you discuss any questions you have with your health care provider. Document Released: 05/20/2007 Document Revised: 07/24/2015 Document Reviewed: 01/25/2015 Elsevier Interactive Patient Education  2018 [employer]. General Anesthesia, Adult, Care After These instructions provide you with information about caring for yourself after your procedure. Your health care provider may also give you more specific instructions. Your treatment has been planned according to current medical practices, but problems sometimes occur. Call your health care provider if you have any problems or questions after your procedure. What can I expect after the procedure? After the procedure, it is common to have:  Vomiting.  A sore throat.  Mental slowness.  It is common to feel:  Nauseous.  Cold or shivery.  Sleepy.  Tired.  Sore or achy, even in parts of your body where you did not have surgery.  Follow these instructions  at home: For at least 24 hours after the procedure:  Do not: ? Participate in activities where you could fall or become injured. ? Drive. ? Use heavy machinery. ? Drink alcohol. ? Take sleeping pills or medicines that cause drowsiness. ? Make important decisions or sign legal documents. ? Take care of children on your own.  Rest. Eating and drinking  If you vomit, drink water, juice, or soup when you can drink without vomiting.  Drink enough fluid to keep your urine clear or pale yellow.  Make sure you have little or no nausea before eating solid foods.  Follow the diet recommended by your health care provider. General instructions  Have a responsible adult stay with you until you are awake and alert.  Return to your normal activities as told by your health care provider. Ask your health care provider what activities are safe for you.  Take over-the-counter and prescription medicines only as told by your health care provider.  If you smoke, do not smoke without supervision.  Keep all follow-up visits as told by your health care provider. This is important. Contact a health care provider if:  You continue to have nausea or vomiting at home, and medicines are not helpful.  You cannot drink fluids or start eating again.  You cannot urinate after 8-12 hours.  You develop a skin rash.  You have fever.  You have increasing redness at the site of your procedure.  Get help right away if:  You have difficulty breathing.  You have chest pain.  You have unexpected bleeding.  You feel that you are having a life-threatening or urgent problem. This information is not intended to replace advice given to you by your health care provider. Make sure you discuss any questions you have with your health care provider. Document Released: 05/19/2000 Document Revised: 07/16/2015 Document Reviewed: 01/25/2015 Elsevier Interactive Patient Education  [employer].

## 2017-10-08 ENCOUNTER — Ambulatory Visit (INDEPENDENT_AMBULATORY_CARE_PROVIDER_SITE_OTHER): Payer: Medicaid Other

## 2017-10-08 ENCOUNTER — Encounter (HOSPITAL_COMMUNITY)
Admission: RE | Admit: 2017-10-08 | Discharge: 2017-10-08 | Disposition: A | Discharge status: Home / Self Care | Payer: Medicaid Other | Source: Ambulatory Visit | Attending: Obstetrics & Gynecology | Admitting: Obstetrics & Gynecology

## 2017-10-08 ENCOUNTER — Encounter (HOSPITAL_COMMUNITY): Payer: Self-pay

## 2017-10-08 ENCOUNTER — Other Ambulatory Visit: Payer: Self-pay

## 2017-10-08 DIAGNOSIS — N921 Excessive and frequent menstruation with irregular cycle: Secondary | ICD-10-CM | POA: Insufficient documentation

## 2017-10-08 DIAGNOSIS — N946 Dysmenorrhea, unspecified: Secondary | ICD-10-CM

## 2017-10-08 DIAGNOSIS — Z01818 Encounter for other preprocedural examination: Secondary | ICD-10-CM | POA: Insufficient documentation

## 2017-10-08 HISTORY — DX: Personal history of urinary calculi: Z87.442

## 2017-10-08 LAB — URINALYSIS, ROUTINE W REFLEX MICROSCOPIC
Bilirubin Urine: NEGATIVE
GLUCOSE, UA: NEGATIVE mg/dL
Hgb urine dipstick: NEGATIVE
Ketones, ur: NEGATIVE mg/dL
LEUKOCYTES UA: NEGATIVE
NITRITE: NEGATIVE
PH: 5 (ref 5.0–8.0)
Protein, ur: NEGATIVE mg/dL
SPECIFIC GRAVITY, URINE: 1.029 (ref 1.005–1.030)

## 2017-10-08 LAB — COMPREHENSIVE METABOLIC PANEL
ALT: 15 U/L (ref 0–44)
ANION GAP: 10 (ref 5–15)
AST: 17 U/L (ref 15–41)
Albumin: 3.5 g/dL (ref 3.5–5.0)
Alkaline Phosphatase: 87 U/L (ref 38–126)
BUN: 10 mg/dL (ref 6–20)
CALCIUM: 8.9 mg/dL (ref 8.9–10.3)
CHLORIDE: 105 mmol/L (ref 98–111)
CO2: 22 mmol/L (ref 22–32)
CREATININE: 0.66 mg/dL (ref 0.44–1.00)
Glucose, Bld: 106 mg/dL — ABNORMAL HIGH (ref 70–99)
Potassium: 3.5 mmol/L (ref 3.5–5.1)
SODIUM: 137 mmol/L (ref 135–145)
Total Bilirubin: 0.5 mg/dL (ref 0.3–1.2)
Total Protein: 7.5 g/dL (ref 6.5–8.1)

## 2017-10-08 LAB — RAPID HIV SCREEN (HIV 1/2 AB+AG)
HIV 1/2 ANTIBODIES: NONREACTIVE
HIV-1 P24 ANTIGEN - HIV24: NONREACTIVE

## 2017-10-08 LAB — CBC
HCT: 41.9 % (ref 36.0–46.0)
Hemoglobin: 13.8 g/dL (ref 12.0–15.0)
MCH: 30.3 pg (ref 26.0–34.0)
MCHC: 32.9 g/dL (ref 30.0–36.0)
MCV: 92.1 fL (ref 78.0–100.0)
PLATELETS: 404 10*3/uL — AB (ref 150–400)
RBC: 4.55 MIL/uL (ref 3.87–5.11)
RDW: 13.6 % (ref 11.5–15.5)
WBC: 17.4 10*3/uL — ABNORMAL HIGH (ref 4.0–10.5)

## 2017-10-08 LAB — HCG, QUANTITATIVE, PREGNANCY

## 2017-10-08 NOTE — Pre-Procedure Instructions (Signed)
CBC routed to Dr Eure. 

## 2017-10-08 NOTE — Progress Notes (Signed)
PELVIC US TA/TV: homogeneous anteverted uterus,wnl,normal ovaries bilat,ovaries appear mobile,EEC 5.9 mm,no free fluid,no pain during ultrasound

## 2017-10-09 LAB — TYPE AND SCREEN
ABO/RH(D): A NEG
ANTIBODY SCREEN: NEGATIVE

## 2017-10-14 ENCOUNTER — Ambulatory Visit (HOSPITAL_COMMUNITY)
Admission: RE | Admit: 2017-10-14 | Discharge: 2017-10-14 | Disposition: A | Discharge status: Home / Self Care | Payer: Medicaid Other | Source: Ambulatory Visit | Attending: Obstetrics & Gynecology | Admitting: Obstetrics & Gynecology

## 2017-10-14 ENCOUNTER — Encounter (HOSPITAL_COMMUNITY)
Admission: RE | Disposition: A | Discharge status: Home / Self Care | Payer: Self-pay | Source: Ambulatory Visit | Attending: Obstetrics & Gynecology

## 2017-10-14 ENCOUNTER — Ambulatory Visit (HOSPITAL_COMMUNITY): Payer: Medicaid Other | Admitting: Anesthesiology

## 2017-10-14 ENCOUNTER — Encounter (HOSPITAL_COMMUNITY): Payer: Self-pay

## 2017-10-14 DIAGNOSIS — M419 Scoliosis, unspecified: Secondary | ICD-10-CM | POA: Insufficient documentation

## 2017-10-14 DIAGNOSIS — Z8379 Family history of other diseases of the digestive system: Secondary | ICD-10-CM | POA: Insufficient documentation

## 2017-10-14 DIAGNOSIS — Z809 Family history of malignant neoplasm, unspecified: Secondary | ICD-10-CM | POA: Insufficient documentation

## 2017-10-14 DIAGNOSIS — F329 Major depressive disorder, single episode, unspecified: Secondary | ICD-10-CM | POA: Insufficient documentation

## 2017-10-14 DIAGNOSIS — F17210 Nicotine dependence, cigarettes, uncomplicated: Secondary | ICD-10-CM | POA: Insufficient documentation

## 2017-10-14 DIAGNOSIS — Z302 Encounter for sterilization: Secondary | ICD-10-CM | POA: Diagnosis present

## 2017-10-14 DIAGNOSIS — Z833 Family history of diabetes mellitus: Secondary | ICD-10-CM | POA: Insufficient documentation

## 2017-10-14 DIAGNOSIS — Z9049 Acquired absence of other specified parts of digestive tract: Secondary | ICD-10-CM | POA: Diagnosis not present

## 2017-10-14 DIAGNOSIS — N840 Polyp of corpus uteri: Secondary | ICD-10-CM | POA: Diagnosis not present

## 2017-10-14 DIAGNOSIS — Z87442 Personal history of urinary calculi: Secondary | ICD-10-CM | POA: Diagnosis not present

## 2017-10-14 DIAGNOSIS — Z881 Allergy status to other antibiotic agents status: Secondary | ICD-10-CM | POA: Insufficient documentation

## 2017-10-14 DIAGNOSIS — Z8489 Family history of other specified conditions: Secondary | ICD-10-CM | POA: Diagnosis not present

## 2017-10-14 DIAGNOSIS — M5430 Sciatica, unspecified side: Secondary | ICD-10-CM | POA: Insufficient documentation

## 2017-10-14 DIAGNOSIS — Z888 Allergy status to other drugs, medicaments and biological substances status: Secondary | ICD-10-CM | POA: Diagnosis not present

## 2017-10-14 DIAGNOSIS — N920 Excessive and frequent menstruation with regular cycle: Secondary | ICD-10-CM | POA: Insufficient documentation

## 2017-10-14 DIAGNOSIS — N921 Excessive and frequent menstruation with irregular cycle: Secondary | ICD-10-CM | POA: Diagnosis not present

## 2017-10-14 DIAGNOSIS — N946 Dysmenorrhea, unspecified: Secondary | ICD-10-CM | POA: Insufficient documentation

## 2017-10-14 DIAGNOSIS — Z882 Allergy status to sulfonamides status: Secondary | ICD-10-CM | POA: Diagnosis not present

## 2017-10-14 DIAGNOSIS — Z885 Allergy status to narcotic agent status: Secondary | ICD-10-CM | POA: Diagnosis not present

## 2017-10-14 DIAGNOSIS — Z818 Family history of other mental and behavioral disorders: Secondary | ICD-10-CM | POA: Diagnosis not present

## 2017-10-14 HISTORY — PX: DILITATION & CURRETTAGE/HYSTROSCOPY WITH NOVASURE ABLATION: SHX5568

## 2017-10-14 HISTORY — PX: LAPAROSCOPIC TUBAL LIGATION: SHX1937

## 2017-10-14 SURGERY — LIGATION, FALLOPIAN TUBE, LAPAROSCOPIC
Anesthesia: General | Site: Vagina

## 2017-10-14 MED ORDER — MIDAZOLAM HCL 2 MG/2ML IJ SOLN
INTRAMUSCULAR | Status: AC
  Filled 2017-10-14: qty 2

## 2017-10-14 MED ORDER — CEFAZOLIN SODIUM-DEXTROSE 2-4 GM/100ML-% IV SOLN
2.0000 g | INTRAVENOUS | Status: AC
  Administered 2017-10-14: 2 g via INTRAVENOUS
  Filled 2017-10-14: qty 100

## 2017-10-14 MED ORDER — SUGAMMADEX SODIUM 200 MG/2ML IV SOLN
INTRAVENOUS | Status: AC
  Filled 2017-10-14: qty 4

## 2017-10-14 MED ORDER — METOCLOPRAMIDE HCL 5 MG/ML IJ SOLN
INTRAMUSCULAR | Status: AC
  Filled 2017-10-14: qty 2

## 2017-10-14 MED ORDER — PROPOFOL 10 MG/ML IV BOLUS
INTRAVENOUS | Status: AC
  Filled 2017-10-14: qty 20

## 2017-10-14 MED ORDER — BUPIVACAINE IN DEXTROSE 0.75-8.25 % IT SOLN
INTRATHECAL | Status: AC
  Filled 2017-10-14: qty 2

## 2017-10-14 MED ORDER — ONDANSETRON HCL 4 MG/2ML IJ SOLN
INTRAMUSCULAR | Status: AC
  Filled 2017-10-14: qty 4

## 2017-10-14 MED ORDER — LIDOCAINE HCL (PF) 1 % IJ SOLN
INTRAMUSCULAR | Status: AC
  Filled 2017-10-14: qty 5

## 2017-10-14 MED ORDER — FENTANYL CITRATE (PF) 100 MCG/2ML IJ SOLN
INTRAMUSCULAR | Status: DC | PRN
  Administered 2017-10-14: 100 ug via INTRAVENOUS

## 2017-10-14 MED ORDER — KETOROLAC TROMETHAMINE 30 MG/ML IJ SOLN
INTRAMUSCULAR | Status: AC
  Filled 2017-10-14: qty 1

## 2017-10-14 MED ORDER — 0.9 % SODIUM CHLORIDE (POUR BTL) OPTIME
TOPICAL | Status: DC | PRN
  Administered 2017-10-14: 1000 mL

## 2017-10-14 MED ORDER — SODIUM CHLORIDE 0.9 % IR SOLN
Status: DC | PRN
  Administered 2017-10-14: 1000 mL

## 2017-10-14 MED ORDER — SUGAMMADEX SODIUM 500 MG/5ML IV SOLN
INTRAVENOUS | Status: DC | PRN
  Administered 2017-10-14: 150 mg via INTRAVENOUS

## 2017-10-14 MED ORDER — ONDANSETRON HCL 4 MG/2ML IJ SOLN
INTRAMUSCULAR | Status: DC | PRN
  Administered 2017-10-14: 4 mg via INTRAVENOUS

## 2017-10-14 MED ORDER — PROMETHAZINE HCL 25 MG/ML IJ SOLN: 6.2500 mg | INTRAMUSCULAR | Status: DC | PRN

## 2017-10-14 MED ORDER — LACTATED RINGERS IV SOLN
INTRAVENOUS | Status: DC
  Administered 2017-10-14: 10:00:00 via INTRAVENOUS

## 2017-10-14 MED ORDER — MEPERIDINE HCL 50 MG/ML IJ SOLN: 6.2500 mg | INTRAMUSCULAR | Status: DC | PRN

## 2017-10-14 MED ORDER — ROCURONIUM BROMIDE 100 MG/10ML IV SOLN
INTRAVENOUS | Status: DC | PRN
  Administered 2017-10-14: 30 mg via INTRAVENOUS

## 2017-10-14 MED ORDER — DIPHENHYDRAMINE HCL 50 MG/ML IJ SOLN
INTRAMUSCULAR | Status: AC
  Filled 2017-10-14: qty 2

## 2017-10-14 MED ORDER — MORPHINE SULFATE (PF) 4 MG/ML IV SOLN
INTRAVENOUS | Status: AC
  Filled 2017-10-14: qty 1

## 2017-10-14 MED ORDER — MIDAZOLAM HCL 2 MG/2ML IJ SOLN
INTRAMUSCULAR | Status: DC | PRN
  Administered 2017-10-14 (×2): 2 mg via INTRAVENOUS

## 2017-10-14 MED ORDER — LACTATED RINGERS IV SOLN: INTRAVENOUS | Status: DC

## 2017-10-14 MED ORDER — FENTANYL CITRATE (PF) 250 MCG/5ML IJ SOLN
INTRAMUSCULAR | Status: AC
  Filled 2017-10-14: qty 5

## 2017-10-14 MED ORDER — SODIUM CHLORIDE 0.9 % IJ SOLN
INTRAMUSCULAR | Status: AC
  Filled 2017-10-14: qty 10

## 2017-10-14 MED ORDER — IBUPROFEN 800 MG PO TABS: 800.0000 mg | ORAL_TABLET | Freq: Three times a day (TID) | ORAL | 0 refills | Status: DC | PRN

## 2017-10-14 MED ORDER — PROPOFOL 10 MG/ML IV BOLUS
INTRAVENOUS | Status: DC | PRN
  Administered 2017-10-14: 50 mg via INTRAVENOUS
  Administered 2017-10-14: 30 mg via INTRAVENOUS
  Administered 2017-10-14: 170 mg via INTRAVENOUS

## 2017-10-14 SURGICAL SUPPLY — 44 items
BLADE SURG SZ11 CARB STEEL (BLADE) ×4 IMPLANT
CLOTH BEACON ORANGE TIMEOUT ST (SAFETY) ×4 IMPLANT
COVER LIGHT HANDLE STERIS (MISCELLANEOUS) ×8 IMPLANT
DRSG TEGADERM 4X4.75 (GAUZE/BANDAGES/DRESSINGS) ×2 IMPLANT
ELECT REM PT RETURN 9FT ADLT (ELECTROSURGICAL) ×4
ELECTRODE REM PT RTRN 9FT ADLT (ELECTROSURGICAL) ×2 IMPLANT
GAUZE 4X4 16PLY RFD (DISPOSABLE) ×4 IMPLANT
GAUZE SPONGE 4X4 16PLY XRAY LF (GAUZE/BANDAGES/DRESSINGS) ×2 IMPLANT
GLOVE BIOGEL PI IND STRL 6.5 (GLOVE) IMPLANT
GLOVE BIOGEL PI IND STRL 7.0 (GLOVE) ×4 IMPLANT
GLOVE BIOGEL PI IND STRL 8 (GLOVE) ×2 IMPLANT
GLOVE BIOGEL PI INDICATOR 6.5 (GLOVE) ×2
GLOVE BIOGEL PI INDICATOR 7.0 (GLOVE) ×4
GLOVE BIOGEL PI INDICATOR 8 (GLOVE) ×2
GLOVE ECLIPSE 8.0 STRL XLNG CF (GLOVE) ×4 IMPLANT
GLOVE SURG SS PI 6.5 STRL IVOR (GLOVE) ×2 IMPLANT
GOWN STRL REUS W/TWL LRG LVL3 (GOWN DISPOSABLE) ×4 IMPLANT
GOWN STRL REUS W/TWL XL LVL3 (GOWN DISPOSABLE) ×4 IMPLANT
HANDPIECE ABLA MINERVA ENDO (MISCELLANEOUS) ×4 IMPLANT
INST SET HYSTEROSCOPY (KITS) ×4 IMPLANT
INST SET LAPROSCOPIC GYN AP (KITS) ×4 IMPLANT
IV NS 1000ML (IV SOLUTION) ×4
IV NS 1000ML BAXH (IV SOLUTION) ×2 IMPLANT
KIT TURNOVER CYSTO (KITS) ×4 IMPLANT
MANIFOLD NEPTUNE II (INSTRUMENTS) ×4 IMPLANT
MARKER SKIN DUAL TIP RULER LAB (MISCELLANEOUS) ×4 IMPLANT
NDL INSUFFLATION 14GA 120MM (NEEDLE) ×2 IMPLANT
NEEDLE INSUFFLATION 14GA 120MM (NEEDLE) ×4 IMPLANT
NS IRRIG 1000ML POUR BTL (IV SOLUTION) ×4 IMPLANT
PACK PERI GYN (CUSTOM PROCEDURE TRAY) ×4 IMPLANT
PAD ARMBOARD 7.5X6 YLW CONV (MISCELLANEOUS) ×4 IMPLANT
PAD TELFA 3X4 1S STER (GAUZE/BANDAGES/DRESSINGS) ×4 IMPLANT
SET BASIN LINEN APH (SET/KITS/TRAYS/PACK) ×4 IMPLANT
SET IRRIG Y TYPE TUR BLADDER L (SET/KITS/TRAYS/PACK) ×4 IMPLANT
SOLUTION ANTI FOG 6CC (MISCELLANEOUS) ×4 IMPLANT
SPONGE GAUZE 2X2 8PLY STER LF (GAUZE/BANDAGES/DRESSINGS) ×1
SPONGE GAUZE 2X2 8PLY STRL LF (GAUZE/BANDAGES/DRESSINGS) ×3 IMPLANT
STAPLER VISISTAT 35W (STAPLE) ×4 IMPLANT
SUT VICRYL 0 UR6 27IN ABS (SUTURE) ×4 IMPLANT
SYR 10ML LL (SYRINGE) ×4 IMPLANT
TROCAR ENDO BLADELESS 11MM (ENDOMECHANICALS) ×4 IMPLANT
TUBING INSUF HEATED (TUBING) ×4 IMPLANT
WARMER LAPAROSCOPE (MISCELLANEOUS) ×4 IMPLANT
YANKAUER SUCT BULB TIP 10FT TU (MISCELLANEOUS) ×4 IMPLANT

## 2017-10-14 NOTE — Discharge Instructions (Signed)
Endometrial Ablation °Endometrial ablation is a procedure that destroys the thin inner layer of the lining of the uterus (endometrium). This procedure may be done: °· To stop heavy periods. °· To stop bleeding that is causing anemia. °· To control irregular bleeding. °· To treat bleeding caused by small tumors (fibroids) in the endometrium. ° °This procedure is often an alternative to major surgery, such as removal of the uterus and cervix (hysterectomy). As a result of this procedure: °· You may not be able to have children. However, if you are premenopausal (you have not gone through menopause): °? You may still have a small chance of getting pregnant. °? You will need to use a reliable method of birth control after the procedure to prevent pregnancy. °· You may stop having a menstrual period, or you may have only a small amount of bleeding during your period. Menstruation may return several years after the procedure. ° °Tell a health care provider about: °· Any allergies you have. °· All medicines you are taking, including vitamins, herbs, eye drops, creams, and over-the-counter medicines. °· Any problems you or family members have had with the use of anesthetic medicines. °· Any blood disorders you have. °· Any surgeries you have had. °· Any medical conditions you have. °What are the risks? °Generally, this is a safe procedure. However, problems may occur, including: °· A hole (perforation) in the uterus or bowel. °· Infection of the uterus, bladder, or vagina. °· Bleeding. °· Damage to other structures or organs. °· An air bubble in the lung (air embolus). °· Problems with pregnancy after the procedure. °· Failure of the procedure. °· Decreased ability to diagnose cancer in the endometrium. ° °What happens before the procedure? °· You will have tests of your endometrium to make sure there are no pre-cancerous cells or cancer cells present. °· You may have an ultrasound of the uterus. °· You may be given  medicines to thin the endometrium. °· Ask your health care provider about: °? Changing or stopping your regular medicines. This is especially important if you take diabetes medicines or blood thinners. °? Taking medicines such as aspirin and ibuprofen. These medicines can thin your blood. Do not take these medicines before your procedure if your doctor tells you not to. °· Plan to have someone take you home from the hospital or clinic. °What happens during the procedure? °· You will lie on an exam table with your feet and legs supported as in a pelvic exam. °· To lower your risk of infection: °? Your health care team will wash or sanitize their hands and put on germ-free (sterile) gloves. °? Your genital area will be washed with soap. °· An IV tube will be inserted into one of your veins. °· You will be given a medicine to help you relax (sedative). °· A surgical instrument with a light and camera (resectoscope) will be inserted into your vagina and moved into your uterus. This allows your surgeon to see inside your uterus. °· Endometrial tissue will be removed using one of the following methods: °? Radiofrequency. This method uses a radiofrequency-alternating electric current to remove the endometrium. °? Cryotherapy. This method uses extreme cold to freeze the endometrium. °? Heated-free liquid. This method uses a heated saltwater (saline) solution to remove the endometrium. °? Microwave. This method uses high-energy microwaves to heat up the endometrium and remove it. °? Thermal balloon. This method involves inserting a catheter with a balloon tip into the uterus. The balloon tip is   filled with heated fluid to remove the endometrium. °The procedure may vary among health care providers and hospitals. °What happens after the procedure? °· Your blood pressure, heart rate, breathing rate, and blood oxygen level will be monitored until the medicines you were given have worn off. °· As tissue healing occurs, you may  notice vaginal bleeding for 4-6 weeks after the procedure. You may also experience: °? Cramps. °? Thin, watery vaginal discharge that is light pink or brown in color. °? A need to urinate more frequently than usual. °? Nausea. °· Do not drive for 24 hours if you were given a sedative. °· Do not have sex or insert anything into your vagina until your health care provider approves. °Summary °· Endometrial ablation is done to treat the many causes of heavy menstrual bleeding. °· The procedure may be done only after medications have been tried to control the bleeding. °· Plan to have someone take you home from the hospital or clinic. °This information is not intended to replace advice given to you by your health care provider. Make sure you discuss any questions you have with your health care provider. °Document Released: 12/21/2003 Document Revised: 02/28/2016 Document Reviewed: 02/28/2016 °Elsevier Interactive Patient Education © 2017 Elsevier Inc. °Laparoscopic Tubal Ligation, Care After °Refer to this sheet in the next few weeks. These instructions provide you with information about caring for yourself after your procedure. Your health care provider may also give you more specific instructions. Your treatment has been planned according to current medical practices, but problems sometimes occur. Call your health care provider if you have any problems or questions after your procedure. °What can I expect after the procedure? °After the procedure, it is common to have: °· A sore throat. °· Discomfort in your shoulder. °· Mild discomfort or cramping in your abdomen. °· Gas pains. °· Pain or soreness in the area where the surgical cut (incision) was made. °· A bloated feeling. °· Tiredness. °· Nausea. °· Vomiting. ° °Follow these instructions at home: °Medicines °· Take over-the-counter and prescription medicines only as told by your health care provider. °· Do not take aspirin because it can cause bleeding. °· Do not  drive or operate heavy machinery while taking prescription pain medicine. °Activity °· Rest for the rest of the day. °· Return to your normal activities as told by your health care provider. Ask your health care provider what activities are safe for you. °Incision care ° °· Follow instructions from your health care provider about how to take care of your incision. Make sure you: °? Wash your hands with soap and water before you change your bandage (dressing). If soap and water are not available, use hand sanitizer. °? Change your dressing as told by your health care provider. °? Leave stitches (sutures) in place. They may need to stay in place for 2 weeks or longer. °· Check your incision area every day for signs of infection. Check for: °? More redness, swelling, or pain. °? More fluid or blood. °? Warmth. °? Pus or a bad smell. °Other Instructions °· Do not take baths, swim, or use a hot tub until your health care provider approves. You may take showers. °· Keep all follow-up visits as told by your health care provider. This is important. °· Have someone help you with your daily household tasks for the first few days. °Contact a health care provider if: °· You have more redness, swelling, or pain around your incision. °· Your incision feels warm   to the touch. °· You have pus or a bad smell coming from your incision. °· The edges of your incision break open after the sutures have been removed. °· Your pain does not improve after 2-3 days. °· You have a rash. °· You repeatedly become dizzy or light-headed. °· Your pain medicine is not helping. °· You are constipated. °Get help right away if: °· You have a fever. °· You faint. °· You have increasing pain in your abdomen. °· You have severe pain in one or both of your shoulders. °· You have fluid or blood coming from your sutures or from your vagina. °· You have shortness of breath or difficulty breathing. °· You have chest pain or leg pain. °· You have ongoing  nausea, vomiting, or diarrhea. °This information is not intended to replace advice given to you by your health care provider. Make sure you discuss any questions you have with your health care provider. °Document Released: 08/30/2004 Document Revised: 07/16/2015 Document Reviewed: 01/21/2015 °Elsevier Interactive Patient Education © 2018 Elsevier Inc. ° °

## 2017-10-14 NOTE — Anesthesia Procedure Notes (Signed)
Procedure Name: Intubation Date/Time: 10/14/2017 10:10 AM Performed by: Vista Deck, CRNA Pre-anesthesia Checklist: Patient identified, Patient being monitored, Timeout performed, Emergency Drugs available and Suction available Patient Re-evaluated:Patient Re-evaluated prior to induction Oxygen Delivery Method: Circle System Utilized Preoxygenation: Pre-oxygenation with 100% oxygen Induction Type: IV induction Ventilation: Mask ventilation without difficulty Laryngoscope Size: Mac and 3 Grade View: Grade I Tube type: Oral Tube size: 7.0 mm Number of attempts: 1 Airway Equipment and Method: stylet and Oral airway Placement Confirmation: ETT inserted through vocal cords under direct vision,  positive ETCO2 and breath sounds checked- equal and bilateral Secured at: 21 cm Tube secured with: Tape Dental Injury: Teeth and Oropharynx as per pre-operative assessment

## 2017-10-14 NOTE — H&P (Signed)
Preoperative History and Physical  Karla Oconnor is a 37 y.o. G2P1011 with Patient's last menstrual period was 09/11/2017 (approximate). admitted for a laparoscopic bilateral tubal ligation using electrocautery and hysteroscopy uterine curettage Minerva endometrial ablation.  Pt wants permanent sterilization She has been using continuous Nuva ring for cycle control but when she has her own periods she has very heavy bleeding, clots wears double pads and uses towels at night As a result will proceed with endometrial ablation  PMH:        Past Medical History:  Diagnosis Date  . Allergic reaction caused by a drug 03/26/2011   Intravenous Cipro  . Depression 09/27/2012  . Kidney infection   . Kidney stone   . Sciatica   . Scoliosis     PSH:          Past Surgical History:  Procedure Laterality Date  . CESAREAN SECTION    . CHOLECYSTECTOMY    . DILATION AND CURETTAGE OF UTERUS  09/10/12  . KIDNEY STONE SURGERY      POb/GynH:              OB History    Gravida  2   Para  1   Term  1   Preterm      AB  1   Living  1     SAB  1   TAB      Ectopic      Multiple      Live Births  1           SH:   Social History        Tobacco Use  . Smoking status: Current Every Day Smoker    Packs/day: 1.00    Years: 20.00    Pack years: 20.00    Types: Cigarettes  . Smokeless tobacco: Never Used  Substance Use Topics  . Alcohol use: Yes    Comment: occ wine  . Drug use: No    FH:         Family History  Problem Relation Age of Onset  . Diabetes Mother   . Bipolar disorder Mother   . Cancer Maternal Aunt   . Lupus Maternal Aunt   . Cirrhosis Maternal Aunt      Allergies:       Allergies  Allergen Reactions  . Ciprofloxacin In D5w     RASH AND BURNING AT IV SITE.  Marland Kitchen. Codeine Hives  . Nubain [Nalbuphine Hcl]   . Sulfa Antibiotics   . Toradol [Ketorolac Tromethamine]     Medications:        Current Outpatient Medications:  .  norethindrone (MICRONOR,CAMILA,ERRIN) 0.35 MG tablet, Take 1 tablet (0.35 mg total) by mouth daily., Disp: 1 Package, Rfl: 11  Review of Systems:   Review of Systems  Constitutional: Negative for fever, chills, weight loss, malaise/fatigue and diaphoresis.  HENT: Negative for hearing loss, ear pain, nosebleeds, congestion, sore throat, neck pain, tinnitus and ear discharge.   Eyes: Negative for blurred vision, double vision, photophobia, pain, discharge and redness.  Respiratory: Negative for cough, hemoptysis, sputum production, shortness of breath, wheezing and stridor.   Cardiovascular: Negative for chest pain, palpitations, orthopnea, claudication, leg swelling and PND.  Gastrointestinal: Positive for abdominal pain. Negative for heartburn, nausea, vomiting, diarrhea, constipation, blood in stool and melena.  Genitourinary: Negative for dysuria, urgency, frequency, hematuria and flank pain.  Musculoskeletal: Negative for myalgias, back pain, joint pain and falls.  Skin: Negative for itching  and rash.  Neurological: Negative for dizziness, tingling, tremors, sensory change, speech change, focal weakness, seizures, loss of consciousness, weakness and headaches.  Endo/Heme/Allergies: Negative for environmental allergies and polydipsia. Does not bruise/bleed easily.  Psychiatric/Behavioral: Negative for depression, suicidal ideas, hallucinations, memory loss and substance abuse. The patient is not nervous/anxious and does not have insomnia.      PHYSICAL EXAM:  Blood pressure (!) 138/92, pulse 97, height 5\' 3"  (1.6 m), weight 141 lb (64 kg), last menstrual period 09/11/2017.    Vitals reviewed. Constitutional: She is oriented to person, place, and time. She appears well-developed and well-nourished.  HENT:  Head: Normocephalic and atraumatic.  Right Ear: External ear normal.  Left Ear: External ear normal.  Nose: Nose normal.   Mouth/Throat: Oropharynx is clear and moist.  Eyes: Conjunctivae and EOM are normal. Pupils are equal, round, and reactive to light. Right eye exhibits no discharge. Left eye exhibits no discharge. No scleral icterus.  Neck: Normal range of motion. Neck supple. No tracheal deviation present. No thyromegaly present.  Cardiovascular: Normal rate, regular rhythm, normal heart sounds and intact distal pulses.  Exam reveals no gallop and no friction rub.   No murmur heard. Respiratory: Effort normal and breath sounds normal. No respiratory distress. She has no wheezes. She has no rales. She exhibits no tenderness.  GI: Soft. Bowel sounds are normal. She exhibits no distension and no mass. There is tenderness. There is no rebound and no guarding.  Genitourinary:       Vulva is normal without lesions Vagina is pink moist without discharge Cervix normal in appearance and pap is normal Uterus is normal size, contour, position, consistency, mobility, non-tender Adnexa is negative with normal sized ovaries by sonogram  Musculoskeletal: Normal range of motion. She exhibits no edema and no tenderness.  Neurological: She is alert and oriented to person, place, and time. She has normal reflexes. She displays normal reflexes. No cranial nerve deficit. She exhibits normal muscle tone. Coordination normal.  Skin: Skin is warm and dry. No rash noted. No erythema. No pallor.  Psychiatric: She has a normal mood and affect. Her behavior is normal. Judgment and thought content normal.    Labs: No results found for this or any previous visit (from the past 336 hour(s)).  EKG: No orders found for this or any previous visit.  Imaging Studies: ImagingResults  No results found.      Assessment: Parous female desires permanent sterilization Menometrorrhagia Dysmenorrhea   Plan: Laparoscopic bilateral tubal ligation using electrocautery + Hysteroscopy uterine curettage minerva endometrial  ablation  Karla Oconnor 09/29/2017 4:36 PM

## 2017-10-14 NOTE — Anesthesia Preprocedure Evaluation (Signed)
Anesthesia Evaluation  Patient identified by MRN, date of birth, ID band Patient awake    Reviewed: Allergy & Precautions, H&P , NPO status , Patient's Chart, lab work & pertinent test results, reviewed documented beta blocker date and time   Airway Mallampati: II  TM Distance: >3 FB Neck ROM: full    Dental no notable dental hx. (+) Loose, Dental Advidsory Given   Pulmonary neg pulmonary ROS, Current Smoker,    Pulmonary exam normal breath sounds clear to auscultation       Cardiovascular Exercise Tolerance: Good negative cardio ROS   Rhythm:regular Rate:Normal     Neuro/Psych  Neuromuscular disease negative neurological ROS  negative psych ROS   GI/Hepatic negative GI ROS, Neg liver ROS,   Endo/Other  negative endocrine ROS  Renal/GU Renal diseasenegative Renal ROS  negative genitourinary   Musculoskeletal   Abdominal   Peds  Hematology negative hematology ROS (+)   Anesthesia Other Findings Kidney stones Lower right front tooth is loose. Discussed that tooth is at risk of loss.  States her understanding and accepts Suboxone at 0700 4mg  not previosuly a listed medication  Reproductive/Obstetrics negative OB ROS                             Anesthesia Physical Anesthesia Plan  ASA: II  Anesthesia Plan: General ETT   Post-op Pain Management:    Induction:   PONV Risk Score and Plan:   Airway Management Planned:   Additional Equipment:   Intra-op Plan:   Post-operative Plan:   Informed Consent: I have reviewed the patients History and Physical, chart, labs and discussed the procedure including the risks, benefits and alternatives for the proposed anesthesia with the patient or authorized representative who has indicated his/her understanding and acceptance.   Dental Advisory Given  Plan Discussed with: CRNA and Anesthesiologist  Anesthesia Plan Comments:          Anesthesia Quick Evaluation

## 2017-10-14 NOTE — Op Note (Signed)
2 procedures  Preoperative Diagnosis:  Multiparous female desires permanent sterilization  Postoperative Diagnosis:  Same as above  Procedure:  Laparoscopic Bilateral Tubal Ligation  Surgeon:  Rockne CoonsLuther H Laconda Basich  Jr MD  Anaesthesia:  LMA  Findings:  Patient had normal pelvic anatomy and no intraperitoneal abnormalities.  Description of Operation:  Patient was taken to the OR and placed into supine position where she underwent LMA anaesthesia.  She was placed in the dorsal lithotomy position and prepped and draped in the usual sterile fashion.  An incision was made in the umbilicus and dissection taken down to the rectus fascia which was incised and opened.  The non bladed trocar was then placed and the peritoneal cavity was insufflated.  The above noted findings were observed.  The Kleppinger electrocautery unit was employed and both tubes were burned to no resistance and beyond in the distal ishtmic and ampullary regions, approximately a 3.5 cm segment bilaterally.  There was good hemostasis.  The fascia, peritoneum and subcutaneous tissue were closed using 0 vicryl.  The skin was closed using staples.  The patient was awakened from anaesthesia and taken to the PACU with all counts being correct x 3.  The patient received Ancef 1 gram and Toradol 30 mg IV preoperatively.  Preoperative diagnosis:  1.   menorrhagia                                         2.  dysmenorrhea   Postoperative diagnoses: Same as above   Procedure: Hysteroscopy, uterine curettage, failed endometrial ablation using Minerva  Surgeon: Lazaro ArmsLuther H Xiara Knisley   Anesthesia: Laryngeal mask airway  Findings: The endometrium had significant synechiae. There were no fibroid or other abnormalities. The Minerva instrument would not allow the ablation to be performed  Description of operation:  A Graves speculum was placed and the anterior cervical lip was grasped with a single-tooth tenaculum. The cervix was dilated serially to allow  passage of the hysteroscope. Diagnostic hysteroscopy was performed and was found to be significant for intra uterine synechiae A vigorous uterine curettage was then performed and all tissue sent to pathology for evaluation.  I then proceeded to perform the Minerva endometrial ablation.   The uterus sounded to 6.5 cm The handpiece was attached to the Minerva power source/machine and the handpiece passed the checklist. The array was squeezed down to remove all of the air present.  The array was then place into the endometrial cavity and deployed to a length of 4.5 cm. The handpiece confirmed appropriate width by being in the green portion of the visual dial. The cervical cuff was then inflated to the point the CO2 indicator was in the green. The endometrial integrity check was then performed and integrity sequence failed  x 3. I assume it was related to the synechiae.     All of the equipment worked well throughout the procedure.  The patient was awakened from anesthesia and taken to the recovery room in good stable condition all counts were correct. She received 2 g of Ancef preoperatively. She will be discharged from the recovery room and followed up in the office in 1- 2 weeks.   She can expect 4 weeks of post procedure bloody watery discharge  Lazaro ArmsLuther H Beryle Zeitz, MD   10/14/2017 11:57 AM

## 2017-10-14 NOTE — Interval H&P Note (Signed)
History and Physical Interval Note:  10/14/2017 9:32 AM  Karla Oconnor  has presented today for surgery, with the diagnosis of Menometrorrhagia Dysmenorrhea Sterilization  The various methods of treatment have been discussed with the patient and family. After consideration of risks, benefits and other options for treatment, the patient has consented to  Procedure(s): LAPAROSCOPIC TUBAL LIGATION (Bilateral) DILATATION & CURETTAGE/HYSTEROSCOPY WITH MINERVA ABLATION (N/A) as a surgical intervention .  The patient's history has been reviewed, patient examined, no change in status, stable for surgery.  I have reviewed the patient's chart and labs.  Questions were answered to the patient's satisfaction.     Lazaro ArmsLuther H Quashon Jesus

## 2017-10-14 NOTE — Anesthesia Postprocedure Evaluation (Signed)
Anesthesia Post Note  Patient: Karla Oconnor  Procedure(s) Performed: LAPAROSCOPIC BILATERAL TUBAL LIGATION USING ELECTROCAUTERY (Bilateral Abdomen) DILATATION & CURETTAGE/HYSTEROSCOPY WITH failed MINERVA  ABLATION (N/A Vagina )  Patient location during evaluation: Short Stay Anesthesia Type: General Level of consciousness: awake and alert and patient cooperative Pain management: satisfactory to patient Vital Signs Assessment: post-procedure vital signs reviewed and stable Respiratory status: spontaneous breathing Cardiovascular status: stable Postop Assessment: no apparent nausea or vomiting Anesthetic complications: no Comments: Discussed missing lower tooth with patient. OR 4 suction and linens checked. No tooth found. Discussed possibility that patient swallowed the tooth. Stated understanding. Patient also states she has a dental appointment next month to get a bridge. Loose upper right incisor remains intact.     Last Vitals:  Vitals:   10/14/17 1145 10/14/17 1155  BP: 127/61 (!) 132/53  Pulse: 77 68  Resp: 14 18  Temp:  36.8 C  SpO2: 96% 99%    Last Pain:  Vitals:   10/14/17 1155  TempSrc: Oral  PainSc: 0-No pain                 Malvika Tung

## 2017-10-14 NOTE — Transfer of Care (Signed)
Immediate Anesthesia Transfer of Care Note  Patient: Karla Oconnor  Procedure(s) Performed: LAPAROSCOPIC BILATERAL TUBAL LIGATION USING ELECTROCAUTERY (Bilateral Abdomen) DILATATION & CURETTAGE/HYSTEROSCOPY WITH failed MINERVA  ABLATION (N/A Vagina )  Patient Location: PACU  Anesthesia Type:General  Level of Consciousness: awake, alert  and patient cooperative  Airway & Oxygen Therapy: Patient Spontanous Breathing  Post-op Assessment: Report given to RN and Post -op Vital signs reviewed and stable  Post vital signs: Reviewed and stable  Last Vitals:  Vitals Value Taken Time  BP    Temp    Pulse 91 10/14/2017 11:34 AM  Resp    SpO2 95 % 10/14/2017 11:34 AM  Vitals shown include unvalidated device data.  Last Pain:  Vitals:   10/14/17 0946  TempSrc: Oral  PainSc: 0-No pain         Complications: Bottom front tooth out per patient. Upper right incisor intact(very loose). Patient a=states she has a dentist appointment next month.

## 2017-10-15 ENCOUNTER — Encounter (HOSPITAL_COMMUNITY): Payer: Self-pay | Admitting: Obstetrics & Gynecology

## 2017-10-21 ENCOUNTER — Telehealth: Payer: Self-pay | Admitting: *Deleted

## 2017-10-21 NOTE — Telephone Encounter (Signed)
Patient asking is she can soak in a bath tub.   Advised to wait until after post-op as note says "failed ablation" so I'm not sure if she was dilated or not.  Verbalized understanding.

## 2017-10-27 ENCOUNTER — Encounter: Payer: Self-pay | Admitting: Obstetrics & Gynecology

## 2017-10-27 ENCOUNTER — Ambulatory Visit (INDEPENDENT_AMBULATORY_CARE_PROVIDER_SITE_OTHER): Payer: Medicaid Other | Admitting: Obstetrics & Gynecology

## 2017-10-27 VITALS — BP 137/84 | HR 106 | Ht 63.0 in | Wt 138.0 lb

## 2017-10-27 DIAGNOSIS — Z9889 Other specified postprocedural states: Secondary | ICD-10-CM

## 2017-10-27 NOTE — Progress Notes (Signed)
  HPI: Patient returns for routine postoperative follow-up having undergone lap tubal + failed ablation on 10/14/2017.  The patient's immediate postoperative recovery has been unremarkable. Since hospital discharge the patient reports no problems.   Current Outpatient Medications: Buprenorphine HCl-Naloxone HCl (SUBOXONE) 4-1 MG FILM, Place under the tongue 2 (two) times daily., Disp: , Rfl:  ibuprofen (ADVIL,MOTRIN) 800 MG tablet, Take 1 tablet (800 mg total) by mouth every 8 (eight) hours as needed., Disp: 30 tablet, Rfl: 0  No current facility-administered medications for this visit.     Blood pressure 137/84, pulse (!) 106, height 5\' 3"  (1.6 m), weight 138 lb (62.6 kg), last menstrual period 10/16/2017.  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

## 2018-06-01 ENCOUNTER — Telehealth: Payer: Self-pay | Admitting: Obstetrics & Gynecology

## 2018-06-01 MED ORDER — NITROFURANTOIN MONOHYD MACRO 100 MG PO CAPS: 100.0000 mg | ORAL_CAPSULE | Freq: Two times a day (BID) | ORAL | 0 refills | Status: AC

## 2018-06-01 NOTE — Telephone Encounter (Signed)
Patient called, stated that she has a UTI, noticed the smell about three days ago.  Frequency urination.   Appointment, tele visit or call meds in?  [employer]  [phone]

## 2018-06-01 NOTE — Telephone Encounter (Signed)
Pt aware med was sent to pharmacy. JSY °

## 2018-06-20 ENCOUNTER — Encounter: Payer: Self-pay | Admitting: Obstetrics & Gynecology

## 2018-06-20 ENCOUNTER — Encounter: Payer: Self-pay | Admitting: Adult Health

## 2018-07-29 ENCOUNTER — Other Ambulatory Visit: Payer: Self-pay | Admitting: Obstetrics & Gynecology

## 2019-01-05 ENCOUNTER — Emergency Department (HOSPITAL_COMMUNITY)
Admission: EM | Admit: 2019-01-05 | Discharge: 2019-01-05 | Disposition: A | Discharge status: Home / Self Care | Payer: Medicaid Other | Attending: Emergency Medicine | Admitting: Emergency Medicine

## 2019-01-05 ENCOUNTER — Emergency Department (HOSPITAL_COMMUNITY): Payer: Medicaid Other

## 2019-01-05 ENCOUNTER — Encounter (HOSPITAL_COMMUNITY): Payer: Self-pay | Admitting: Emergency Medicine

## 2019-01-05 ENCOUNTER — Other Ambulatory Visit: Payer: Self-pay

## 2019-01-05 DIAGNOSIS — N201 Calculus of ureter: Secondary | ICD-10-CM | POA: Insufficient documentation

## 2019-01-05 DIAGNOSIS — Z79899 Other long term (current) drug therapy: Secondary | ICD-10-CM | POA: Diagnosis not present

## 2019-01-05 DIAGNOSIS — F17210 Nicotine dependence, cigarettes, uncomplicated: Secondary | ICD-10-CM | POA: Insufficient documentation

## 2019-01-05 DIAGNOSIS — R109 Unspecified abdominal pain: Secondary | ICD-10-CM | POA: Diagnosis present

## 2019-01-05 LAB — URINALYSIS, ROUTINE W REFLEX MICROSCOPIC
Bilirubin Urine: NEGATIVE
Glucose, UA: NEGATIVE mg/dL
Hgb urine dipstick: NEGATIVE
Ketones, ur: 20 mg/dL — AB
Leukocytes,Ua: NEGATIVE
Nitrite: NEGATIVE
Protein, ur: 30 mg/dL — AB
Specific Gravity, Urine: 1.027 (ref 1.005–1.030)
pH: 5 (ref 5.0–8.0)

## 2019-01-05 LAB — CBC WITH DIFFERENTIAL/PLATELET
Abs Immature Granulocytes: 0.06 10*3/uL (ref 0.00–0.07)
Basophils Absolute: 0.1 10*3/uL (ref 0.0–0.1)
Basophils Relative: 1 %
Eosinophils Absolute: 0.3 10*3/uL (ref 0.0–0.5)
Eosinophils Relative: 2 %
HCT: 40.1 % (ref 36.0–46.0)
Hemoglobin: 12.7 g/dL (ref 12.0–15.0)
Immature Granulocytes: 0 %
Lymphocytes Relative: 16 %
Lymphs Abs: 2.5 10*3/uL (ref 0.7–4.0)
MCH: 30.4 pg (ref 26.0–34.0)
MCHC: 31.7 g/dL (ref 30.0–36.0)
MCV: 95.9 fL (ref 80.0–100.0)
Monocytes Absolute: 1 10*3/uL (ref 0.1–1.0)
Monocytes Relative: 6 %
Neutro Abs: 12.1 10*3/uL — ABNORMAL HIGH (ref 1.7–7.7)
Neutrophils Relative %: 75 %
Platelets: 328 10*3/uL (ref 150–400)
RBC: 4.18 MIL/uL (ref 3.87–5.11)
RDW: 14.5 % (ref 11.5–15.5)
WBC: 16 10*3/uL — ABNORMAL HIGH (ref 4.0–10.5)
nRBC: 0 % (ref 0.0–0.2)

## 2019-01-05 LAB — COMPREHENSIVE METABOLIC PANEL
ALT: 10 U/L (ref 0–44)
AST: 15 U/L (ref 15–41)
Albumin: 4.2 g/dL (ref 3.5–5.0)
Alkaline Phosphatase: 61 U/L (ref 38–126)
Anion gap: 11 (ref 5–15)
BUN: 11 mg/dL (ref 6–20)
CO2: 24 mmol/L (ref 22–32)
Calcium: 9.5 mg/dL (ref 8.9–10.3)
Chloride: 106 mmol/L (ref 98–111)
Creatinine, Ser: 0.71 mg/dL (ref 0.44–1.00)
GFR calc Af Amer: >60 mL/min (ref >60)
GFR calc non Af Amer: >60 mL/min (ref >60)
Glucose, Bld: 128 mg/dL — ABNORMAL HIGH (ref 70–99)
Potassium: 3.6 mmol/L (ref 3.5–5.1)
Sodium: 141 mmol/L (ref 135–145)
Total Bilirubin: 0.6 mg/dL (ref 0.3–1.2)
Total Protein: 7.4 g/dL (ref 6.5–8.1)

## 2019-01-05 LAB — LIPASE, BLOOD: Lipase: 18 U/L (ref 11–51)

## 2019-01-05 LAB — POC URINE PREG, ED: Preg Test, Ur: NEGATIVE

## 2019-01-05 MED ORDER — IBUPROFEN 800 MG PO TABS: 800.0000 mg | ORAL_TABLET | Freq: Three times a day (TID) | ORAL | 0 refills | Status: AC

## 2019-01-05 MED ORDER — HYDROMORPHONE HCL 1 MG/ML IJ SOLN
1.0000 mg | Freq: Once | INTRAMUSCULAR | Status: AC
  Administered 2019-01-05: 1 mg via INTRAVENOUS
  Filled 2019-01-05: qty 1

## 2019-01-05 MED ORDER — KETOROLAC TROMETHAMINE 30 MG/ML IJ SOLN
30.0000 mg | Freq: Once | INTRAMUSCULAR | Status: AC
  Administered 2019-01-05: 30 mg via INTRAVENOUS
  Filled 2019-01-05: qty 1

## 2019-01-05 MED ORDER — SODIUM CHLORIDE 0.9 % IV BOLUS
1000.0000 mL | Freq: Once | INTRAVENOUS | Status: AC
  Administered 2019-01-05: 1000 mL via INTRAVENOUS

## 2019-01-05 MED ORDER — ONDANSETRON HCL 4 MG/2ML IJ SOLN
4.0000 mg | Freq: Once | INTRAMUSCULAR | Status: AC
  Administered 2019-01-05: 4 mg via INTRAVENOUS
  Filled 2019-01-05: qty 2

## 2019-01-05 MED ORDER — PROMETHAZINE HCL 25 MG PO TABS: 25.0000 mg | ORAL_TABLET | Freq: Four times a day (QID) | ORAL | 0 refills | Status: AC | PRN

## 2019-01-05 MED ORDER — HYDROMORPHONE HCL 1 MG/ML IJ SOLN
1.0000 mg | Freq: Once | INTRAMUSCULAR | Status: AC
  Administered 2019-01-05: 11:00:00 1 mg via INTRAVENOUS
  Filled 2019-01-05: qty 1

## 2019-01-05 NOTE — ED Triage Notes (Signed)
Pt c/o severe LT sided flank pain that woke her up out of sleep around 0730 this morning. Denies urinary symptoms. Hx of kidney stones.

## 2019-01-05 NOTE — Discharge Instructions (Addendum)
Your work-up today shows that you have a 2 mm left-sided kidney stone.  Most likely this stone will pass.  You need to strain all urine.  Call the urologist listed to arrange a follow-up appointment in a few days if the stone does not pass and you are still experiencing symptoms.  Also, your CT shows that you have some constipation.  You may continue taking your laxative as directed.  Return to the ER for any worsening symptoms.

## 2019-01-05 NOTE — ED Notes (Signed)
Patient transported to CT 

## 2019-01-05 NOTE — ED Provider Notes (Signed)
[facility] EMERGENCY DEPARTMENT Provider Note   CSN: [id number] Arrival date & time: 01/05/19  4128     History   Chief Complaint Chief Complaint  Patient presents with  . Flank Pain    HPI Chelsy JOHNEISHA BROADEN is a 38 y.o. female.     HPI   Tamaiya DEZYRAE KENSINGER is a 38 y.o. female with a past medical history of hypokalemia, narcotic dependence and previous kidney stones.  She presents to the Emergency Department complaining of sudden onset of left flank pain that began early this morning around 730.  She states the pain woke her from sleep.  She describes sharp, severe pain to her left flank and left lower abdomen.  She reports history of constipation and states that she had a large, hard, dry, stool yesterday but did not feel that she completely emptied her bowels.  She describes the pain as feeling as though it "moves."  She believes that she is constipated.  She reports history of kidney stones, but states that she has not had urinary symptoms recently.  Pain is not been associated with fever, vomiting, dysuria, vaginal pain or discharge.  She took her 4 mg Suboxone this morning without relief of pain.   Past Medical History:  Diagnosis Date  . Allergic reaction caused by a drug 03/26/2011   Intravenous Cipro  . Depression 09/27/2012  . History of kidney stones   . Kidney infection   . Kidney stone   . Sciatica   . Scoliosis     Patient Active Problem List   Diagnosis Date Noted  . Smoker 08/20/2017  . Screening examination for STD (sexually transmitted disease) 08/20/2017  . Encounter for initial prescription of contraceptive pills 08/20/2017  . Encounter for well woman exam with routine gynecological exam 08/20/2017  . Depression 09/27/2012  . Anemia 03/27/2011  . Yeast vaginitis 03/27/2011  . Allergic reaction caused by a drug 03/26/2011  . Pyelonephritis 03/23/2011  . Dehydration 03/23/2011  . Hypokalemia 03/23/2011  . Hyponatremia 03/23/2011  . Narcotic dependence,  in remission (HCC) 03/23/2011  . Tobacco abuse 03/23/2011    Past Surgical History:  Procedure Laterality Date  . CESAREAN SECTION    . CHOLECYSTECTOMY    . DILATION AND CURETTAGE OF UTERUS  09/10/12  . DILITATION & CURRETTAGE/HYSTROSCOPY WITH NOVASURE ABLATION N/A 10/14/2017   Procedure: DILATATION & CURETTAGE/HYSTEROSCOPY WITH failed MINERVA  ABLATION;  Surgeon: Lazaro Arms, MD;  Location: [facility];  Service: Gynecology;  Laterality: N/A;  . KIDNEY STONE SURGERY    . LAPAROSCOPIC TUBAL LIGATION Bilateral 10/14/2017   Procedure: LAPAROSCOPIC BILATERAL TUBAL LIGATION USING ELECTROCAUTERY;  Surgeon: Lazaro Arms, MD;  Location: [facility];  Service: Gynecology;  Laterality: Bilateral;     OB History    Gravida  2   Para  1   Term  1   Preterm      AB  1   Living  1     SAB  1   TAB      Ectopic      Multiple      Live Births  1            Home Medications    Prior to Admission medications   Medication Sig Start Date End Date Taking? Authorizing Provider  Buprenorphine HCl-Naloxone HCl (SUBOXONE) 4-1 MG FILM Place under the tongue 2 (two) times daily.    [provider]  Eulah Citizen 0.12-0.015 MG/24HR vaginal ring INSERT 1  INTO VAGINA AND  LEAVE IN PLACE FOR 3 CONSECUTIVE WEEKS THEN REMOVE FOR 1 WEEK 08/04/18   Lazaro ArmsEure, Luther H, MD  ibuprofen (ADVIL,MOTRIN) 800 MG tablet Take 1 tablet (800 mg total) by mouth every 8 (eight) hours as needed. 10/14/17   Lazaro ArmsEure, Luther H, MD  nitrofurantoin, macrocrystal-monohydrate, (MACROBID) 100 MG capsule Take 1 capsule (100 mg total) by mouth 2 (two) times daily. 06/01/18   Lazaro ArmsEure, Luther H, MD    Family History Family History  Problem Relation Age of Onset  . Diabetes Mother   . Bipolar disorder Mother   . Cancer Maternal Aunt   . Lupus Maternal Aunt   . Cirrhosis Maternal Aunt     Social History Social History   Tobacco Use  . Smoking status: Current Every Day Smoker    Packs/day: 1.00    Years: 20.00    Pack years:  20.00    Types: Cigarettes  . Smokeless tobacco: Never Used  Substance Use Topics  . Alcohol use: Yes    Comment: occ wine  . Drug use: No     Allergies   Ciprofloxacin in d5w, Codeine, Nubain [nalbuphine hcl], Sulfa antibiotics, and Toradol [ketorolac tromethamine]   Review of Systems Review of Systems  Constitutional: Negative for appetite change, chills and fever.  HENT: Negative for sore throat and trouble swallowing.   Respiratory: Negative for chest tightness and shortness of breath.   Cardiovascular: Negative for chest pain.  Gastrointestinal: Positive for abdominal pain and constipation. Negative for abdominal distention, blood in stool, diarrhea, nausea and vomiting.  Genitourinary: Positive for flank pain. Negative for decreased urine volume, difficulty urinating, dysuria, vaginal bleeding and vaginal discharge.  Musculoskeletal: Negative for back pain.  Skin: Negative for color change and rash.  Neurological: Negative for dizziness, weakness and numbness.  Hematological: Negative for adenopathy.     Physical Exam Updated Vital Signs BP 129/84 (BP Location: Left Arm)   Pulse 70   Temp 98.2 F (36.8 C) (Oral)   Resp (!) 22   Ht 5\' 3"  (1.6 m)   Wt 59 kg   LMP 12/28/2018 (Approximate)   SpO2 99%   BMI 23.03 kg/m   Physical Exam Vitals signs and nursing note reviewed.  Constitutional:      Appearance: She is not toxic-appearing.     Comments: Patient squatting in the floor in the fetal position upon entering the exam room  HENT:     Mouth/Throat:     Mouth: Mucous membranes are moist.  Cardiovascular:     Rate and Rhythm: Normal rate and regular rhythm.     Pulses: Normal pulses.  Pulmonary:     Effort: Pulmonary effort is normal.     Breath sounds: Normal breath sounds.  Chest:     Chest wall: No tenderness.  Abdominal:     General: There is no distension.     Palpations: Abdomen is soft. There is no mass.     Tenderness: There is abdominal  tenderness in the left lower quadrant. There is no left CVA tenderness or guarding. Negative signs include McBurney's sign.  Musculoskeletal: Normal range of motion.  Skin:    General: Skin is warm.  Neurological:     General: No focal deficit present.     Mental Status: She is alert.      ED Treatments / Results  Labs (all labs ordered are listed, but only abnormal results are displayed) Labs Reviewed  COMPREHENSIVE METABOLIC PANEL - Abnormal; Notable for the following components:  Result Value   Glucose, Bld 128 (*)    All other components within normal limits  CBC WITH DIFFERENTIAL/PLATELET - Abnormal; Notable for the following components:   WBC 16.0 (*)    Neutro Abs 12.1 (*)    All other components within normal limits  URINALYSIS, ROUTINE W REFLEX MICROSCOPIC - Abnormal; Notable for the following components:   APPearance HAZY (*)    Ketones, ur 20 (*)    Protein, ur 30 (*)    Bacteria, UA RARE (*)    All other components within normal limits  LIPASE, BLOOD  POC URINE PREG, ED    EKG None  Radiology Ct Renal Stone Study  Result Date: 01/05/2019 CLINICAL DATA:  Acute left flank pain. EXAM: CT ABDOMEN AND PELVIS WITHOUT CONTRAST TECHNIQUE: Multidetector CT imaging of the abdomen and pelvis was performed following the standard protocol without IV contrast. COMPARISON:  October 02, 2016. FINDINGS: Lower chest: No acute abnormality. Hepatobiliary: No focal liver abnormality is seen. Status post cholecystectomy. No biliary dilatation. Pancreas: Unremarkable. No pancreatic ductal dilatation or surrounding inflammatory changes. Spleen: Normal in size without focal abnormality. Adrenals/Urinary Tract: Adrenal glands appear normal. Right kidney and ureter are unremarkable. Mild left hydroureteronephrosis is noted secondary to 2 mm calculus at left ureterovesical junction. Urinary bladder is decompressed. Stomach/Bowel: Stomach is within normal limits. Appendix appears normal. No  evidence of bowel wall thickening, distention, or inflammatory changes. Stool is noted throughout the colon. Vascular/Lymphatic: No significant vascular findings are present. No enlarged abdominal or pelvic lymph nodes. Reproductive: Uterus and bilateral adnexa are unremarkable. Other: No abdominal wall hernia or abnormality. No abdominopelvic ascites. Musculoskeletal: No acute or significant osseous findings. IMPRESSION: Mild left hydroureteronephrosis is noted secondary to 2 mm calculus at left ureterovesical junction. Electronically Signed   By: Lupita Raider M.D.   On: 01/05/2019 12:28    Procedures Procedures (including critical care time)  Medications Ordered in ED Medications  sodium chloride 0.9 % bolus 1,000 mL (has no administration in time range)  HYDROmorphone (DILAUDID) injection 1 mg (has no administration in time range)  ondansetron (ZOFRAN) injection 4 mg (has no administration in time range)     Initial Impression / Assessment and Plan / ED Course  I have reviewed the triage vital signs and the nursing notes.  Pertinent labs & imaging results that were available during my care of the patient were reviewed by me and considered in my medical decision making (see chart for details).       Upon entering the exam room, patient is squatting in the floor in the fetal position rocking back and forth.  Left lower abdominal and flank pain without history of urinary symptoms.  She appears uncomfortable but nontoxic.  On recheck, pt has required two doses of pain medication for pain control, but has improved.  She notes an allergy to Toradol stating that given IV, caused a localized redness at the IV site w/o other complications and resolved after the IV was flushed.  She continues to have some flank pain and wants to try IV Toradol again.  Nurse notified of her previous issue and will flush site well after administration  On recheck, pt reports that pain completely resolved after  the IV Toradol and she was observed w/o complications.  Agrees to strain all urine, close f/u with urology.  Also advised her to take laxative for her constipation.  Return precautions discussed.    Final Clinical Impressions(s) / ED Diagnoses   Final diagnoses:  Ureterolithiasis    ED Discharge Orders    None       Kem Parkinson, PA-C 01/05/19 1446    Veryl Speak, MD 01/06/19 [phone]

## 2019-06-12 ENCOUNTER — Emergency Department (HOSPITAL_COMMUNITY)
Admission: EM | Admit: 2019-06-12 | Discharge: 2019-06-12 | Disposition: A | Discharge status: Home / Self Care | Payer: Medicaid Other | Attending: Emergency Medicine | Admitting: Emergency Medicine

## 2019-06-12 ENCOUNTER — Other Ambulatory Visit: Payer: Self-pay

## 2019-06-12 ENCOUNTER — Emergency Department (HOSPITAL_COMMUNITY): Payer: Medicaid Other

## 2019-06-12 ENCOUNTER — Encounter (HOSPITAL_COMMUNITY): Payer: Self-pay | Admitting: Emergency Medicine

## 2019-06-12 DIAGNOSIS — F17210 Nicotine dependence, cigarettes, uncomplicated: Secondary | ICD-10-CM | POA: Diagnosis not present

## 2019-06-12 DIAGNOSIS — R109 Unspecified abdominal pain: Secondary | ICD-10-CM | POA: Diagnosis present

## 2019-06-12 DIAGNOSIS — N12 Tubulo-interstitial nephritis, not specified as acute or chronic: Secondary | ICD-10-CM | POA: Diagnosis not present

## 2019-06-12 DIAGNOSIS — N201 Calculus of ureter: Secondary | ICD-10-CM

## 2019-06-12 DIAGNOSIS — Z79899 Other long term (current) drug therapy: Secondary | ICD-10-CM | POA: Insufficient documentation

## 2019-06-12 LAB — URINALYSIS, ROUTINE W REFLEX MICROSCOPIC
Bilirubin Urine: NEGATIVE
Glucose, UA: NEGATIVE mg/dL
Ketones, ur: 5 mg/dL — AB
Nitrite: NEGATIVE
Protein, ur: 30 mg/dL — AB
RBC / HPF: >50 RBC/hpf — ABNORMAL HIGH (ref 0–5)
Specific Gravity, Urine: 1.027 (ref 1.005–1.030)
pH: 5 (ref 5.0–8.0)

## 2019-06-12 LAB — COMPREHENSIVE METABOLIC PANEL
ALT: 15 U/L (ref 0–44)
AST: 19 U/L (ref 15–41)
Albumin: 4.3 g/dL (ref 3.5–5.0)
Alkaline Phosphatase: 66 U/L (ref 38–126)
Anion gap: 10 (ref 5–15)
BUN: 14 mg/dL (ref 6–20)
CO2: 23 mmol/L (ref 22–32)
Calcium: 9.1 mg/dL (ref 8.9–10.3)
Chloride: 105 mmol/L (ref 98–111)
Creatinine, Ser: 0.63 mg/dL (ref 0.44–1.00)
GFR calc Af Amer: >60 mL/min (ref >60)
GFR calc non Af Amer: >60 mL/min (ref >60)
Glucose, Bld: 108 mg/dL — ABNORMAL HIGH (ref 70–99)
Potassium: 3.4 mmol/L — ABNORMAL LOW (ref 3.5–5.1)
Sodium: 138 mmol/L (ref 135–145)
Total Bilirubin: 0.3 mg/dL (ref 0.3–1.2)
Total Protein: 8.2 g/dL — ABNORMAL HIGH (ref 6.5–8.1)

## 2019-06-12 LAB — CBC WITH DIFFERENTIAL/PLATELET
Abs Immature Granulocytes: 0.05 10*3/uL (ref 0.00–0.07)
Basophils Absolute: 0.1 10*3/uL (ref 0.0–0.1)
Basophils Relative: 1 %
Eosinophils Absolute: 0.2 10*3/uL (ref 0.0–0.5)
Eosinophils Relative: 1 %
HCT: 42.4 % (ref 36.0–46.0)
Hemoglobin: 13.4 g/dL (ref 12.0–15.0)
Immature Granulocytes: 0 %
Lymphocytes Relative: 17 %
Lymphs Abs: 2.4 10*3/uL (ref 0.7–4.0)
MCH: 30 pg (ref 26.0–34.0)
MCHC: 31.6 g/dL (ref 30.0–36.0)
MCV: 94.9 fL (ref 80.0–100.0)
Monocytes Absolute: 0.6 10*3/uL (ref 0.1–1.0)
Monocytes Relative: 4 %
Neutro Abs: 11.3 10*3/uL — ABNORMAL HIGH (ref 1.7–7.7)
Neutrophils Relative %: 77 %
Platelets: 424 10*3/uL — ABNORMAL HIGH (ref 150–400)
RBC: 4.47 MIL/uL (ref 3.87–5.11)
RDW: 14.3 % (ref 11.5–15.5)
WBC: 14.7 10*3/uL — ABNORMAL HIGH (ref 4.0–10.5)
nRBC: 0 % (ref 0.0–0.2)

## 2019-06-12 LAB — LIPASE, BLOOD: Lipase: 20 U/L (ref 11–51)

## 2019-06-12 LAB — PREGNANCY, URINE: Preg Test, Ur: NEGATIVE

## 2019-06-12 MED ORDER — LACTATED RINGERS IV BOLUS
500.0000 mL | Freq: Once | INTRAVENOUS | Status: AC
  Administered 2019-06-12: 500 mL via INTRAVENOUS

## 2019-06-12 MED ORDER — CEPHALEXIN 500 MG PO CAPS: 500.0000 mg | ORAL_CAPSULE | Freq: Two times a day (BID) | ORAL | 0 refills | Status: AC

## 2019-06-12 MED ORDER — SODIUM CHLORIDE 0.9 % IV SOLN
1.0000 g | Freq: Once | INTRAVENOUS | Status: AC
  Administered 2019-06-12: 1 g via INTRAVENOUS
  Filled 2019-06-12: qty 10

## 2019-06-12 MED ORDER — ONDANSETRON HCL 4 MG/2ML IJ SOLN
4.0000 mg | Freq: Once | INTRAMUSCULAR | Status: AC
  Administered 2019-06-12: 17:00:00 4 mg via INTRAVENOUS
  Filled 2019-06-12: qty 2

## 2019-06-12 NOTE — ED Provider Notes (Signed)
[facility] EMERGENCY DEPARTMENT Provider Note   CSN: [id number] Arrival date & time: 06/12/19  1547     History Chief Complaint  Patient presents with  . Flank Pain    Karla Oconnor is a 39 y.o. female with a past medical history of kidney stones, pyelonephritis, frequent UTIs, narcotic abuse in remission with Suboxone, who presents today for evaluation of right-sided flank pain.  She reports that her pain started about an hour ago.  She states that it hit out of the blue and is waxing and waning and she can feel spasms.  She took a BC powder about 30 minutes ago without relief.  She also attempted a heating pad without change in symptoms.  She reports that this feels similar to when she had a kidney stone at the end of last year.  She reports nausea without vomiting.  She notes that over the past week she has had urinary frequency, dysuria, urgency, and odors.  She states she believes she has a UTI.  No fevers.    At time of initial evaluation she refuses narcotic pain medication due to her history, however wants Toradol if labs allow.   HPI     Past Medical History:  Diagnosis Date  . Allergic reaction caused by a drug 03/26/2011   Intravenous Cipro  . Depression 09/27/2012  . History of kidney stones   . Kidney infection   . Kidney stone   . Sciatica   . Scoliosis     Patient Active Problem List   Diagnosis Date Noted  . Smoker 08/20/2017  . Screening examination for STD (sexually transmitted disease) 08/20/2017  . Encounter for initial prescription of contraceptive pills 08/20/2017  . Encounter for well woman exam with routine gynecological exam 08/20/2017  . Depression 09/27/2012  . Anemia 03/27/2011  . Yeast vaginitis 03/27/2011  . Allergic reaction caused by a drug 03/26/2011  . Pyelonephritis 03/23/2011  . Dehydration 03/23/2011  . Hypokalemia 03/23/2011  . Hyponatremia 03/23/2011  . Narcotic dependence, in remission (HCC) 03/23/2011  . Tobacco abuse  03/23/2011    Past Surgical History:  Procedure Laterality Date  . CESAREAN SECTION    . CHOLECYSTECTOMY    . DILATION AND CURETTAGE OF UTERUS  09/10/12  . DILITATION & CURRETTAGE/HYSTROSCOPY WITH NOVASURE ABLATION N/A 10/14/2017   Procedure: DILATATION & CURETTAGE/HYSTEROSCOPY WITH failed MINERVA  ABLATION;  Surgeon: Lazaro Arms, MD;  Location: [facility];  Service: Gynecology;  Laterality: N/A;  . KIDNEY STONE SURGERY    . LAPAROSCOPIC TUBAL LIGATION Bilateral 10/14/2017   Procedure: LAPAROSCOPIC BILATERAL TUBAL LIGATION USING ELECTROCAUTERY;  Surgeon: Lazaro Arms, MD;  Location: [facility];  Service: Gynecology;  Laterality: Bilateral;     OB History    Gravida  2   Para  1   Term  1   Preterm      AB  1   Living  1     SAB  1   TAB      Ectopic      Multiple      Live Births  1           Family History  Problem Relation Age of Onset  . Diabetes Mother   . Bipolar disorder Mother   . Cancer Maternal Aunt   . Lupus Maternal Aunt   . Cirrhosis Maternal Aunt     Social History   Tobacco Use  . Smoking status: Current Every Day Smoker    Packs/day: 1.00  Years: 20.00    Pack years: 20.00    Types: Cigarettes  . Smokeless tobacco: Never Used  Substance Use Topics  . Alcohol use: Yes    Comment: occ wine  . Drug use: No    Home Medications Prior to Admission medications   Medication Sig Start Date End Date Taking? Authorizing Provider  Buprenorphine HCl-Naloxone HCl (SUBOXONE) 4-1 MG FILM Place under the tongue 2 (two) times daily.    [provider]  cephALEXin (KEFLEX) 500 MG capsule Take 1 capsule (500 mg total) by mouth 2 (two) times daily for 14 days. 06/12/19 06/26/19  Cristina Gong, PA-C  ELURYNG 0.12-0.015 MG/24HR vaginal ring INSERT 1  INTO VAGINA AND LEAVE IN PLACE FOR 3 CONSECUTIVE WEEKS THEN REMOVE FOR 1 WEEK Patient not taking: Reported on 01/05/2019 08/04/18   Lazaro Arms, MD  ibuprofen (ADVIL) 800 MG tablet Take 1  tablet (800 mg total) by mouth 3 (three) times daily. 01/05/19   Triplett, Tammy, PA-C  nitrofurantoin, macrocrystal-monohydrate, (MACROBID) 100 MG capsule Take 1 capsule (100 mg total) by mouth 2 (two) times daily. Patient not taking: Reported on 01/05/2019 06/01/18   Lazaro Arms, MD  promethazine (PHENERGAN) 25 MG tablet Take 1 tablet (25 mg total) by mouth every 6 (six) hours as needed for nausea or vomiting. May cause drowsiness 01/05/19   Triplett, Tammy, PA-C    Allergies    Ciprofloxacin in d5w, Codeine, Nubain [nalbuphine hcl], Sulfa antibiotics, and Toradol [ketorolac tromethamine]  Review of Systems   Review of Systems  Constitutional: Negative for chills and fever.  Respiratory: Negative for chest tightness and shortness of breath.   Cardiovascular: Negative for chest pain.  Genitourinary: Positive for dysuria, flank pain, frequency and urgency. Negative for menstrual problem, vaginal bleeding, vaginal discharge and vaginal pain.  Musculoskeletal: Negative for back pain and neck pain.  Neurological: Negative for weakness.  Psychiatric/Behavioral: Negative for confusion.  All other systems reviewed and are negative.   Physical Exam Updated Vital Signs BP (!) 107/95   Pulse 84   Temp 98.3 F (36.8 C) (Oral)   Resp 18   Ht 5\' 3"  (1.6 m)   Wt 56.7 kg   LMP 06/12/2019 (Exact Date)   SpO2 100%   BMI 22.14 kg/m   Physical Exam Vitals and nursing note reviewed.  Constitutional:      Appearance: She is well-developed. She is not diaphoretic.     Comments: Appears uncomfortable, pacing around room, holding right side.   HENT:     Head: Normocephalic and atraumatic.  Eyes:     General: No scleral icterus.       Right eye: No discharge.        Left eye: No discharge.     Conjunctiva/sclera: Conjunctivae normal.  Cardiovascular:     Rate and Rhythm: Normal rate and regular rhythm.     Pulses: Normal pulses.     Heart sounds: Normal heart sounds.  Pulmonary:      Effort: Pulmonary effort is normal. No respiratory distress.     Breath sounds: No stridor.  Abdominal:     General: There is no distension.     Tenderness: There is abdominal tenderness (Mild epigastric). There is right CVA tenderness. There is no left CVA tenderness or guarding.  Musculoskeletal:        General: No deformity.     Cervical back: Normal range of motion and neck supple. No rigidity.  Skin:    General: Skin is warm  and dry.  Neurological:     General: No focal deficit present.     Mental Status: She is alert.     Cranial Nerves: No cranial nerve deficit.     Motor: No abnormal muscle tone.     Gait: Gait normal.  Psychiatric:        Mood and Affect: Mood normal.        Behavior: Behavior normal.     ED Results / Procedures / Treatments   Labs (all labs ordered are listed, but only abnormal results are displayed) Labs Reviewed  URINALYSIS, ROUTINE W REFLEX MICROSCOPIC - Abnormal; Notable for the following components:      Result Value   APPearance HAZY (*)    Hgb urine dipstick LARGE (*)    Ketones, ur 5 (*)    Protein, ur 30 (*)    Leukocytes,Ua TRACE (*)    RBC / HPF >50 (*)    Bacteria, UA MANY (*)    All other components within normal limits  COMPREHENSIVE METABOLIC PANEL - Abnormal; Notable for the following components:   Potassium 3.4 (*)    Glucose, Bld 108 (*)    Total Protein 8.2 (*)    All other components within normal limits  CBC WITH DIFFERENTIAL/PLATELET - Abnormal; Notable for the following components:   WBC 14.7 (*)    Platelets 424 (*)    Neutro Abs 11.3 (*)    All other components within normal limits  CULTURE, BLOOD (ROUTINE X 2)  CULTURE, BLOOD (ROUTINE X 2)  URINE CULTURE  PREGNANCY, URINE  LIPASE, BLOOD    EKG None  Radiology CT Renal Stone Study  Result Date: 06/12/2019 CLINICAL DATA:  39 year old female with history of kidney stones and right flank pain. EXAM: CT ABDOMEN AND PELVIS WITHOUT CONTRAST TECHNIQUE:  Multidetector CT imaging of the abdomen and pelvis was performed following the standard protocol without IV contrast. COMPARISON:  CT abdomen pelvis dated 01/05/2019. FINDINGS: Evaluation of this exam is limited in the absence of intravenous contrast. Lower chest: The visualized lung bases are clear. No intra-abdominal free air or free fluid. Hepatobiliary: The liver is unremarkable. No intrahepatic biliary ductal dilatation. Cholecystectomy. No retained calcified stone noted in the central CBD. Pancreas: Unremarkable. No pancreatic ductal dilatation or surrounding inflammatory changes. Spleen: Normal in size without focal abnormality. Adrenals/Urinary Tract: The adrenal glands are unremarkable. There is a punctate distal right ureteral calculus with mild right hydronephrosis. The left kidney, left ureter, and urinary bladder are unremarkable. Stomach/Bowel: There is moderate stool throughout the colon. There is no bowel obstruction or active inflammation. The appendix is unremarkable as visualized. Vascular/Lymphatic: Minimal atherosclerotic calcification of the iliac arteries. The IVC is unremarkable. No portal venous gas. There is no adenopathy. Reproductive: The uterus is anteverted and grossly unremarkable. No adnexal masses. Other: None Musculoskeletal: No acute or significant osseous findings. IMPRESSION: A punctate distal right ureteral calculus with mild right hydronephrosis. Electronically Signed   By: Anner Crete M.D.   On: 06/12/2019 18:51    Procedures Procedures (including critical care time)  Medications Ordered in ED Medications  lactated ringers bolus 500 mL (0 mLs Intravenous Stopped 06/12/19 1806)  ondansetron (ZOFRAN) injection 4 mg (4 mg Intravenous Given 06/12/19 1658)  cefTRIAXone (ROCEPHIN) 1 g in sodium chloride 0.9 % 100 mL IVPB (0 g Intravenous Stopped 06/12/19 2013)    ED Course  I have reviewed the triage vital signs and the nursing notes.  Pertinent labs & imaging  results that were  available during my care of the patient were reviewed by me and considered in my medical decision making (see chart for details).    MDM Rules/Calculators/A&P                     Patient is a 39 year old woman with a past medical history of frequent UTIs, pyelonephritis, kidney stones who presents today for evaluation of 1 hour of sudden onset right-sided flank pain.  In addition she has had dysuria, frequency and urgency for approximately 1 week.  On initial exam she appears uncomfortable.  She states she is unable to give a urine sample without fluids.  She initially declined narcotic pain medicine.  Plan to order labs, UA, and urine culture.  If urine appears infected plan to obtain CT renal study given concern for possible infected stone to obtain location and dimensions.  If urine does not appear infected, based on her history as long as labs are reassuring we will not plan to obtain CT scan.  Labs show leukocytosis at 14.7, CMP with mild hypokalemia however otherwise unremarkable.  Lipase is not elevated.  Urine is concerning for infection with many bacteria, 21-50 white cells, over 50 reds and 0-5 squamous epithelial cells.  Urine culture is sent.  CT renal stone study was performed showing a punctate right sided distal ureteral calculus with mild right hydronephrosis.  Given that this is in the setting of infection I spoke with on-call urology Dr. Laverle Patter.  Patient is given IV Rocephin in the emergency room, and blood cultures were obtained prior to the administration of antibiotics in addition to urine culture.  Her pain fully resolved around the time of her CT scan, she felt better and wished for discharge home.  She was offered Toradol, which she declined.  She is given a prescription for Keflex with treatment for possible pyelonephritis.  Dr. Laverle Patter recommended close outpatient follow-up and strict return precautions.  I discussed this with patient who states her agreement,  wishes to go home tonight.  Return precautions were discussed with patient who states their understanding.  At the time of discharge patient denied any unaddressed complaints or concerns.  Patient is agreeable for discharge home.  Note: Portions of this report may have been transcribed using voice recognition software. Every effort was made to ensure accuracy; however, inadvertent computerized transcription errors may be present  Final Clinical Impression(s) / ED Diagnoses Final diagnoses:  Ureterolithiasis  Pyelonephritis    Rx / DC Orders ED Discharge Orders         Ordered    cephALEXin (KEFLEX) 500 MG capsule  2 times daily     06/12/19 2019           Cristina Gong, [place] 06/13/19 0054    Terald Sleeper, MD 06/13/19 1229

## 2019-06-12 NOTE — ED Notes (Signed)
ED Provider at bedside. 

## 2019-06-12 NOTE — ED Triage Notes (Signed)
Flank pain   BC powder 30 minutes ago   Hx of kidney stone   States feels like last time   No PCP

## 2019-06-12 NOTE — Discharge Instructions (Addendum)
   If you develop fevers, your pain is uncontrolled, you feel generally unwell, you are unable to urinate, you vomit multiple times or have any other concerns please return to the emergency room.    You may have diarrhea from the antibiotics.  It is very important that you continue to take the antibiotics even if you get diarrhea unless a medical professional tells you that you may stop taking them.  If you stop too early the bacteria you are being treated for will become stronger and you may need different, more powerful antibiotics that have more side effects and worsening diarrhea.  Please stay well hydrated and consider probiotics as they may decrease the severity of your diarrhea.  Please be aware that if you take any hormonal contraception (birth control pills, nexplanon, the ring, etc) that your birth control will not work while you are taking antibiotics and you need to use back up protection as directed on the birth control medication information insert.

## 2019-06-15 LAB — URINE CULTURE: Culture: 60000 — AB

## 2019-06-16 ENCOUNTER — Telehealth: Payer: Self-pay

## 2019-06-16 NOTE — Telephone Encounter (Signed)
Post ED Visit - Positive Culture Follow-up  Culture report reviewed by antimicrobial stewardship pharmacist: [facility] []  , Pharm.D. []  Enzo Bi, Pharm.D., BCPS AQ-ID []  , Pharm.D., BCPS []  Celedonio Miyamoto, Pharm.D., BCPS []  [place], Garvin Fila.D., BCPS, AAHIVP []  , Pharm.D., BCPS, AAHIVP []  Georgina Pillion, PharmD, BCPS []  , PharmD, BCPS []  [place], PharmD, BCPS []  [address], PharmD []  , PharmD, BCPS []  Estella Husk, PharmD Pharm [facility] []  , PharmD []  Phillips Climes, PharmD []  , PharmD []  Agapito Games, Rph []  ) Verlan Friends, PharmD []  , PharmD []  Mervyn Gay, PharmD []  , PharmD []  Vinnie Level, PharmD []  Jettie Pagan, PharmD []  Autumn Patty, PharmD []  , PharmD []  Len Childs, PharmD   Positive urine culture Treated with Cephalexin, organism sensitive to the same and no further patient follow-up is required at this time.  06/16/2019, 8:51 AM

## 2019-06-17 LAB — CULTURE, BLOOD (ROUTINE X 2)
Culture: NO GROWTH
Culture: NO GROWTH
Special Requests: ADEQUATE
Special Requests: ADEQUATE

## 2021-05-14 IMAGING — CT CT RENAL STONE PROTOCOL
2 of 4 series · 16 of 46 positions shown, 18 images · non-contrast
Comparison: October 02, 2016.

CLINICAL DATA: Acute left flank pain.

EXAM:
CT ABDOMEN AND PELVIS WITHOUT CONTRAST
TECHNIQUE: Multidetector CT imaging of the abdomen and pelvis was performed
following the standard protocol without IV contrast.

[Series 2: axial st · axial · 0.70mm/px · z∈[+761,+1156]mm · 13 of 91 slices shown, 15 images]
[im 6/91  soft-tissue]
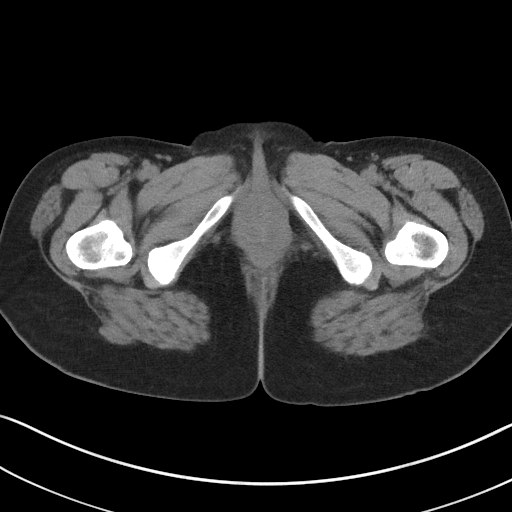
[im 6/91  bone]
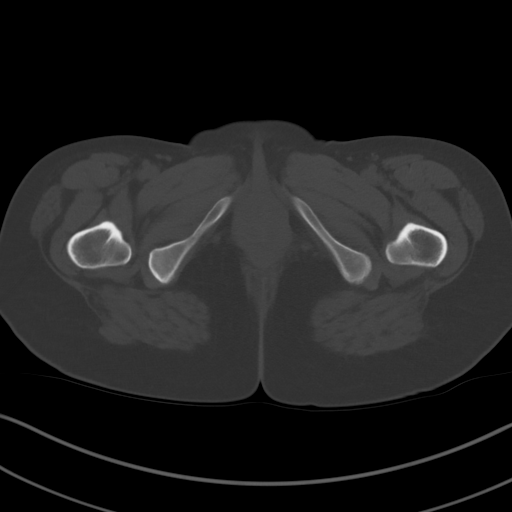
[im 11/91  soft-tissue]
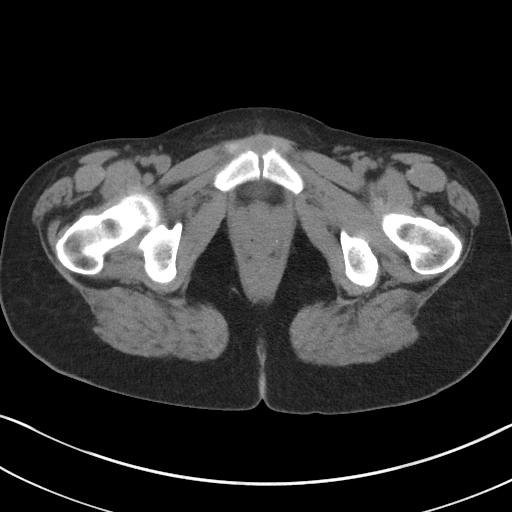
[im 22/91  soft-tissue]
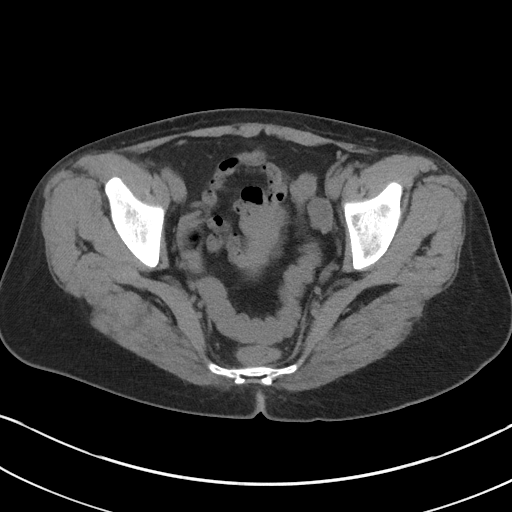
[im 27/91  soft-tissue]
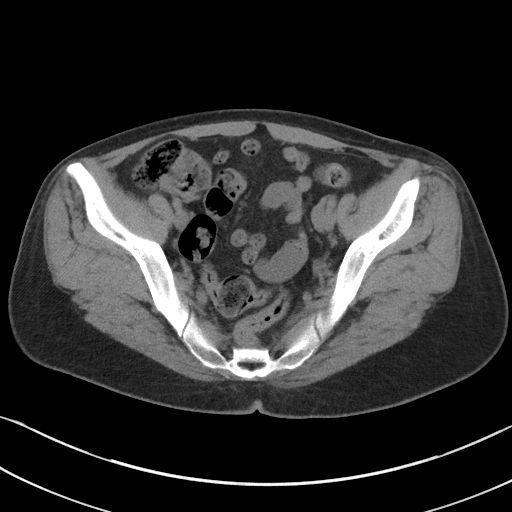
[im 32/91  soft-tissue]
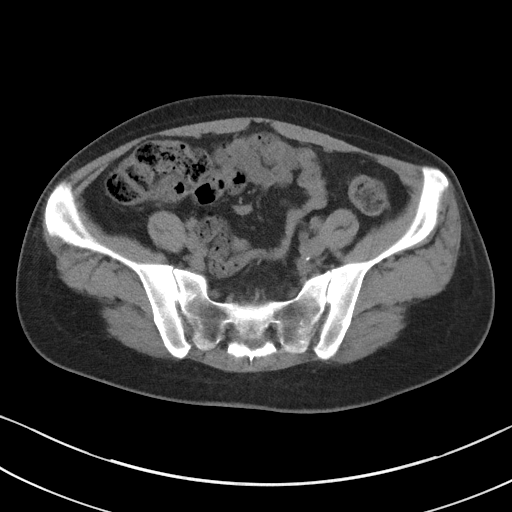
[im 38/91  soft-tissue]
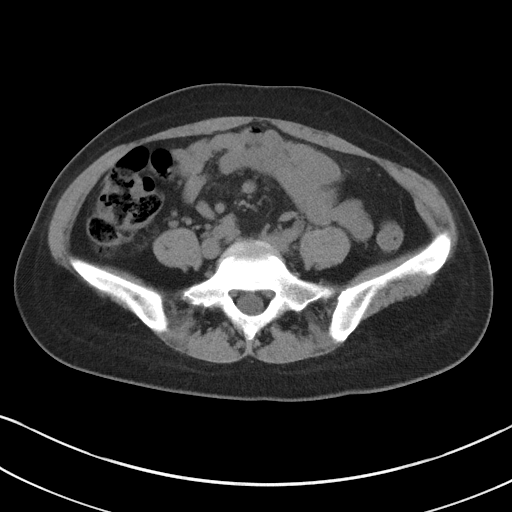
[im 48/91  soft-tissue]
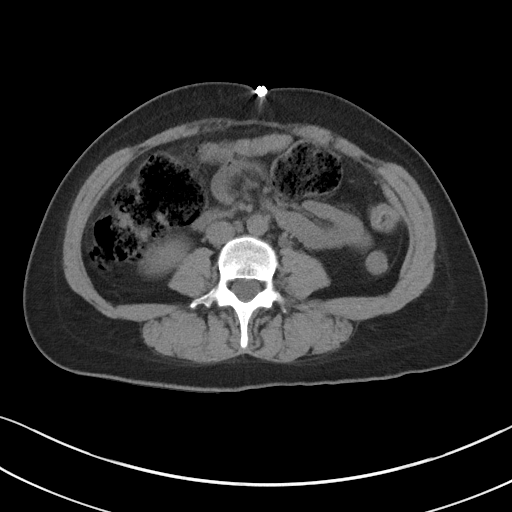
[im 53/91  soft-tissue]
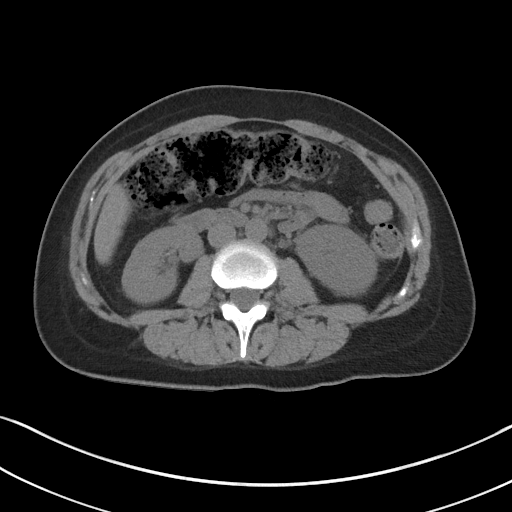
[im 59/91  soft-tissue]
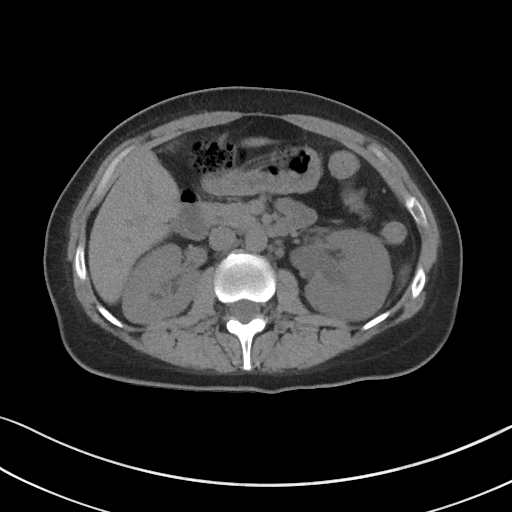
[im 59/91  bone]
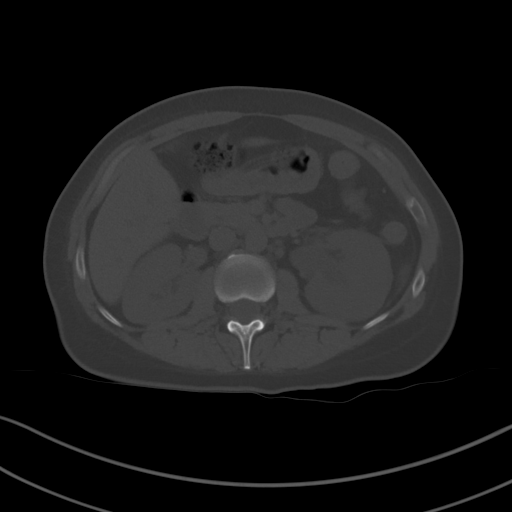
[im 64/91  soft-tissue]
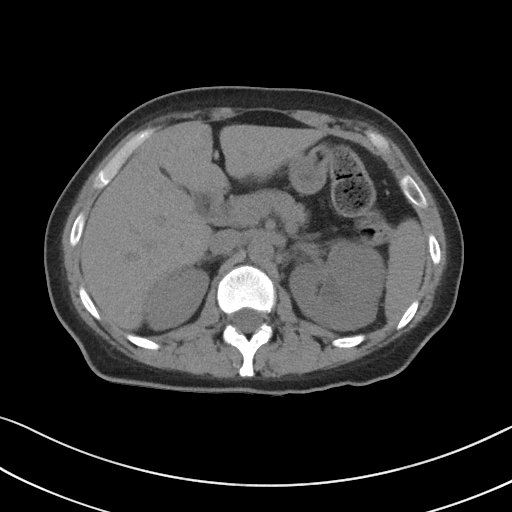
[im 69/91  soft-tissue]
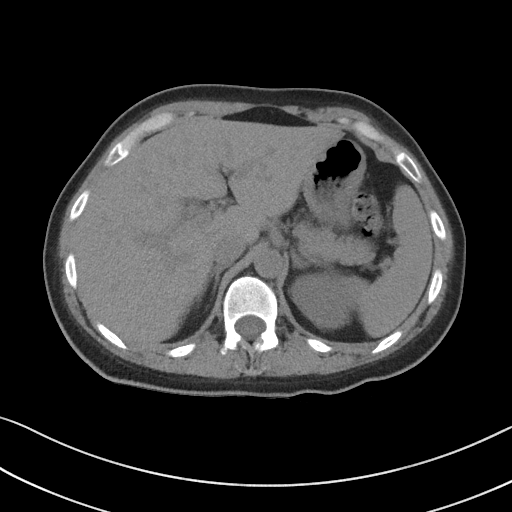
[im 80/91  soft-tissue]
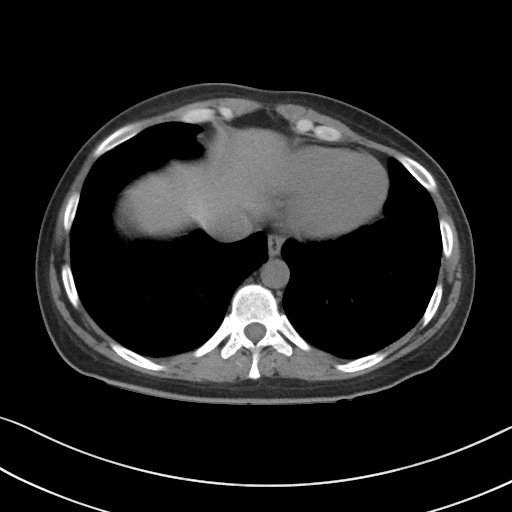
[im 85/91  soft-tissue]
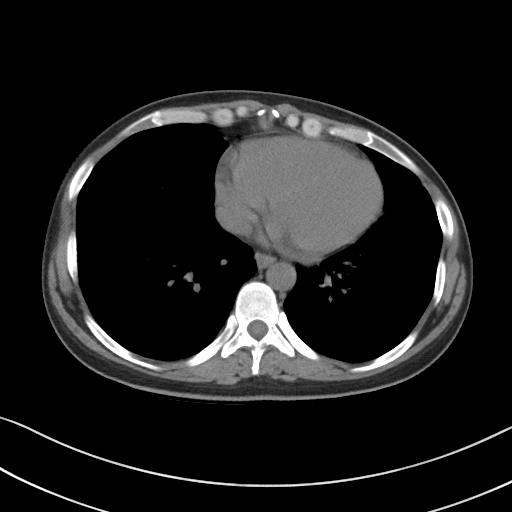

[Series 5: coronal st · coronal · 0.70mm/px · 3 of 100 slices shown]
[im 34/100  soft-tissue]
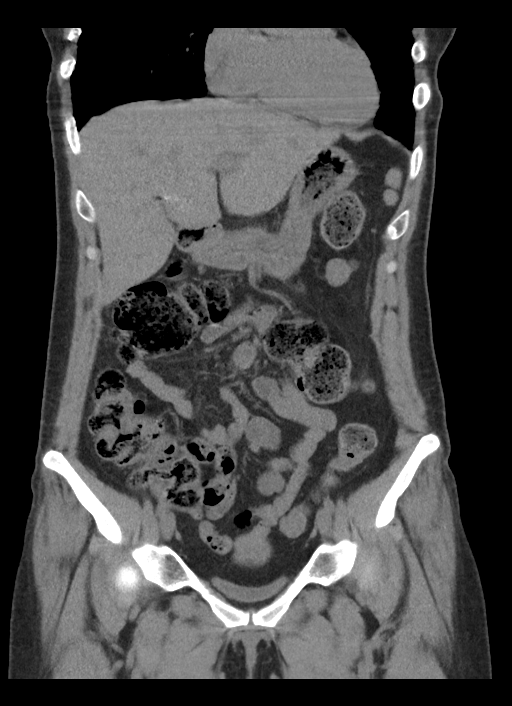
[im 45/100  soft-tissue]
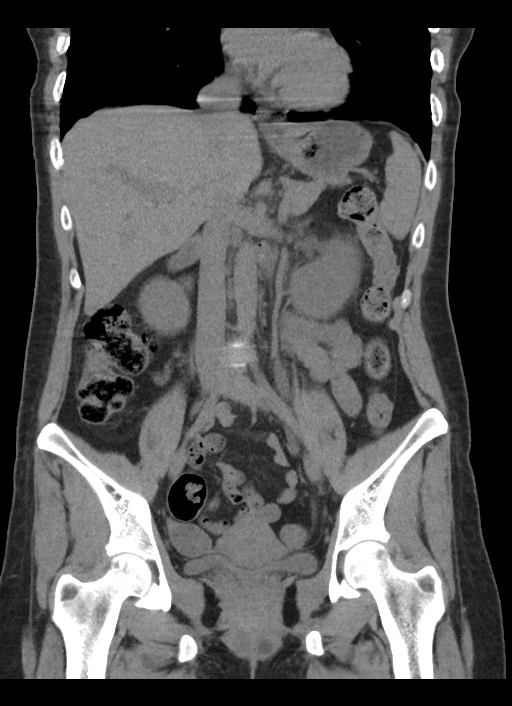
[im 56/100  soft-tissue]
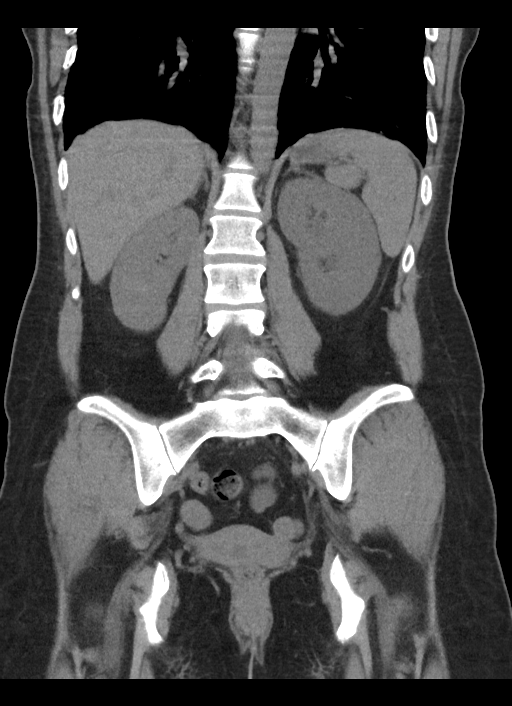

[16 of 46 positions shown; findings below may reference images not displayed]

FINDINGS: Lower chest: No acute abnormality.

Hepatobiliary: No focal liver abnormality is seen. Status post
cholecystectomy. No biliary dilatation.

Pancreas: Unremarkable. No pancreatic ductal dilatation or
surrounding inflammatory changes.

Spleen: Normal in size without focal abnormality.

Adrenals/Urinary Tract: Adrenal glands appear normal. Right kidney
and ureter are unremarkable. Mild left hydroureteronephrosis is
noted secondary to 2 mm calculus at left ureterovesical junction.
Urinary bladder is decompressed.

Stomach/Bowel: Stomach is within normal limits. Appendix appears
normal. No evidence of bowel wall thickening, distention, or
inflammatory changes. Stool is noted throughout the colon.

Vascular/Lymphatic: No significant vascular findings are present. No
enlarged abdominal or pelvic lymph nodes.

Reproductive: Uterus and bilateral adnexa are unremarkable.

Other: No abdominal wall hernia or abnormality. No abdominopelvic
ascites.

Musculoskeletal: No acute or significant osseous findings.
IMPRESSION: Mild left hydroureteronephrosis is noted secondary to 2 mm calculus
at left ureterovesical junction.

## 2021-10-19 IMAGING — CT CT RENAL STONE PROTOCOL
2 of 4 series · 16 of 46 positions shown, 18 images · non-contrast
Comparison: CT abdomen pelvis dated 01/05/2019.

CLINICAL DATA: 39-year-old female with history of kidney stones and
right flank pain.

EXAM:
CT ABDOMEN AND PELVIS WITHOUT CONTRAST
TECHNIQUE: Multidetector CT imaging of the abdomen and pelvis was performed
following the standard protocol without IV contrast.

[Series 2: axial st · axial · 0.84mm/px · z∈[+807,+1222]mm · 13 of 97 slices shown, 15 images]
[im 7/97  soft-tissue]
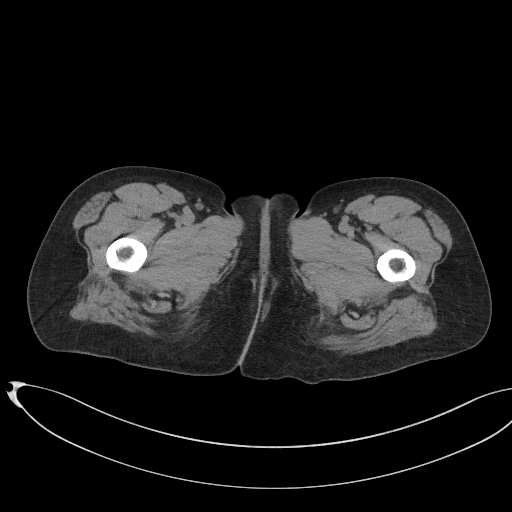
[im 7/97  bone]
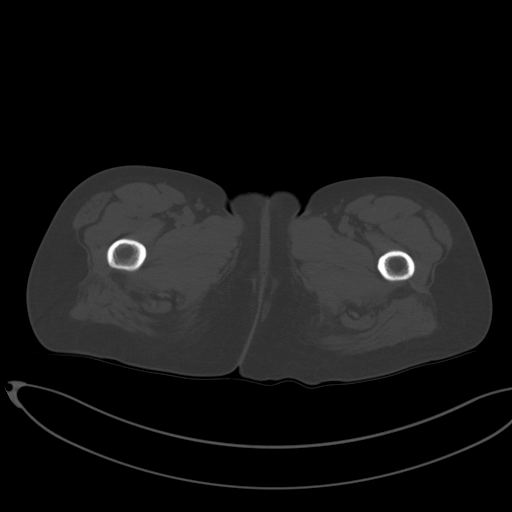
[im 14/97  soft-tissue]
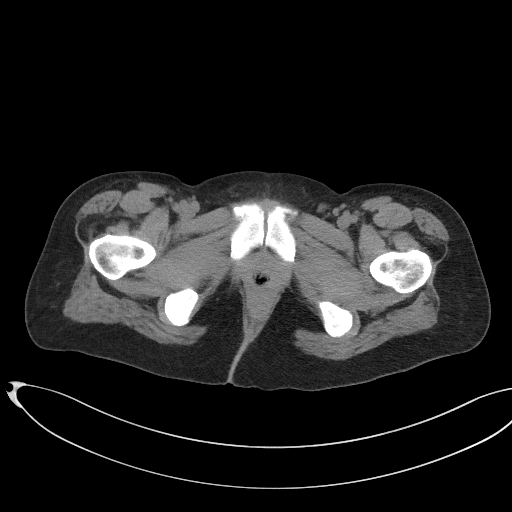
[im 21/97  soft-tissue]
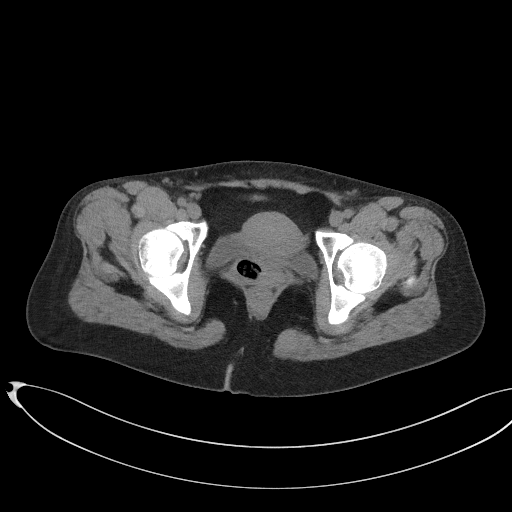
[im 28/97  soft-tissue]
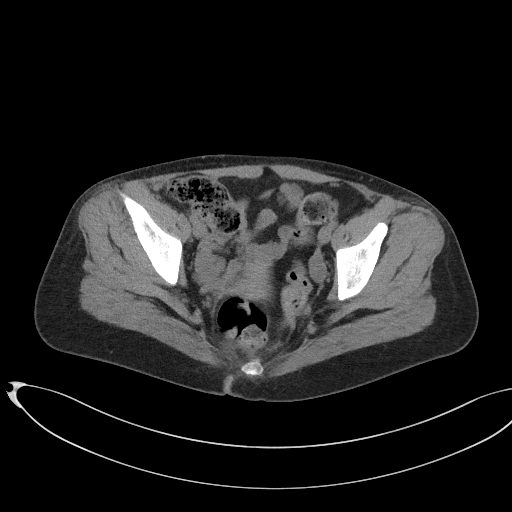
[im 35/97  soft-tissue]
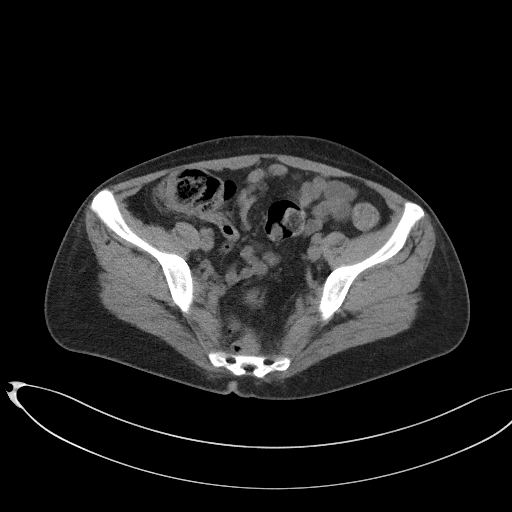
[im 42/97  soft-tissue]
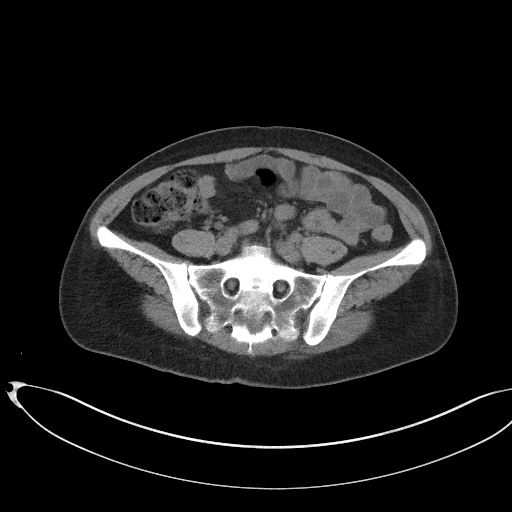
[im 49/97  soft-tissue]
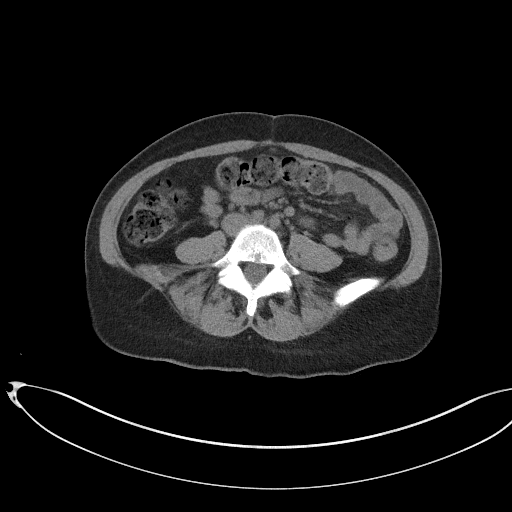
[im 55/97  soft-tissue]
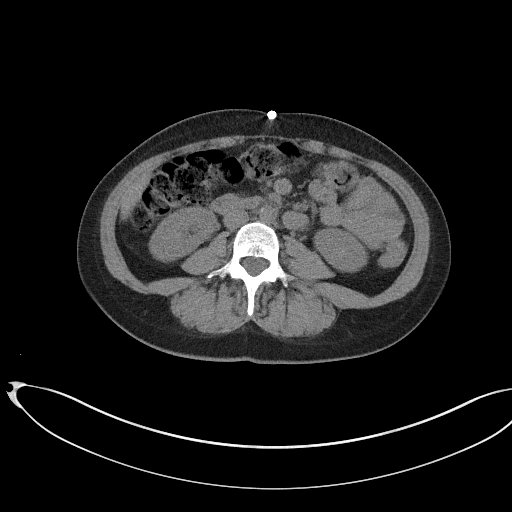
[im 62/97  soft-tissue]
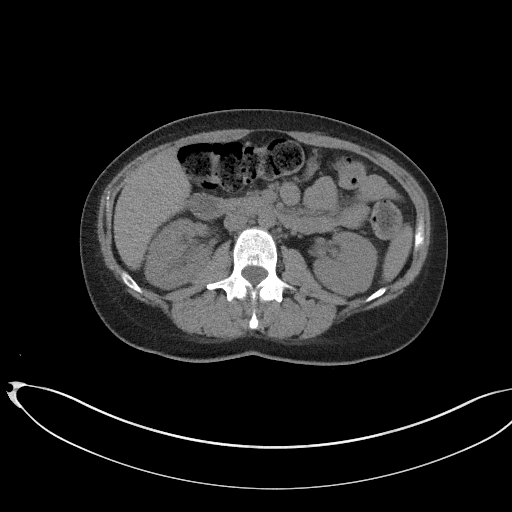
[im 62/97  bone]
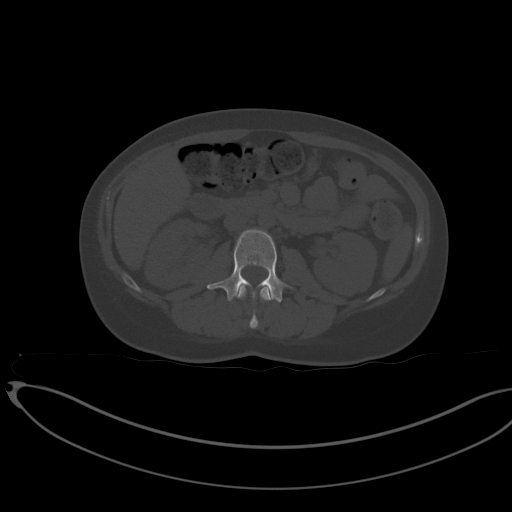
[im 69/97  soft-tissue]
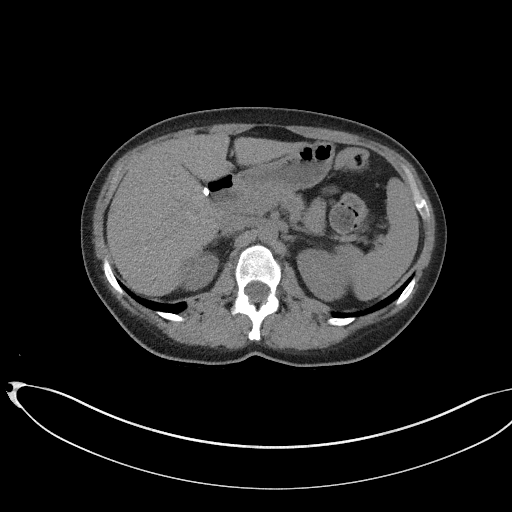
[im 76/97  soft-tissue]
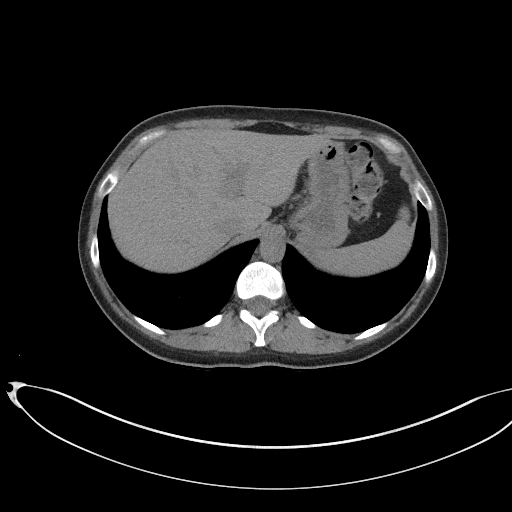
[im 83/97  soft-tissue]
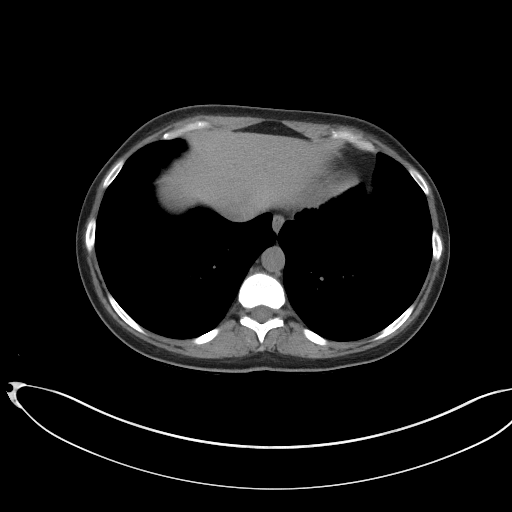
[im 90/97  soft-tissue]
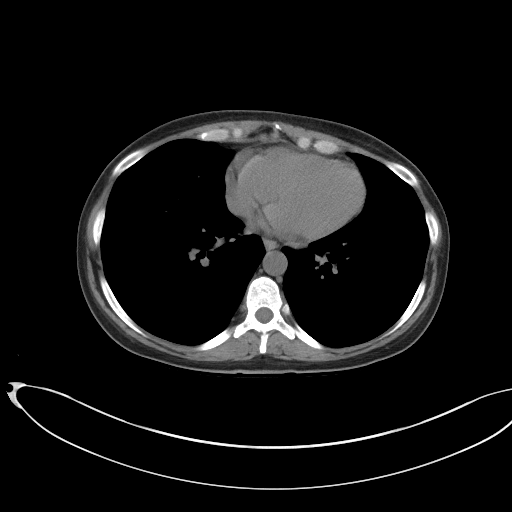

[Series 5: coronal st · coronal · 0.77mm/px · 3 of 87 slices shown]
[im 29/87  soft-tissue]
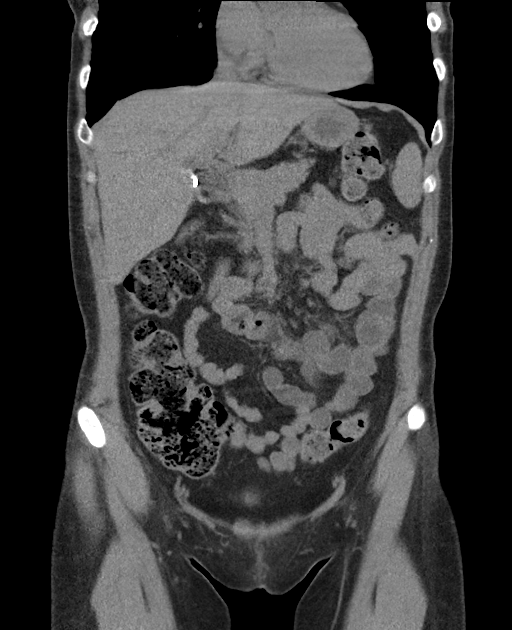
[im 39/87  soft-tissue]
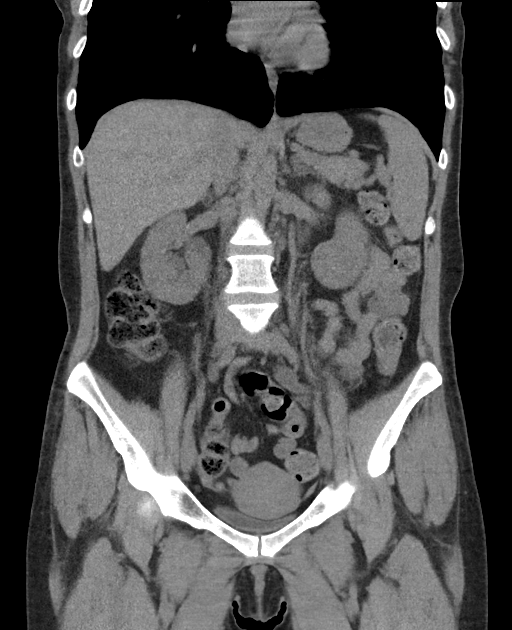
[im 48/87  soft-tissue]
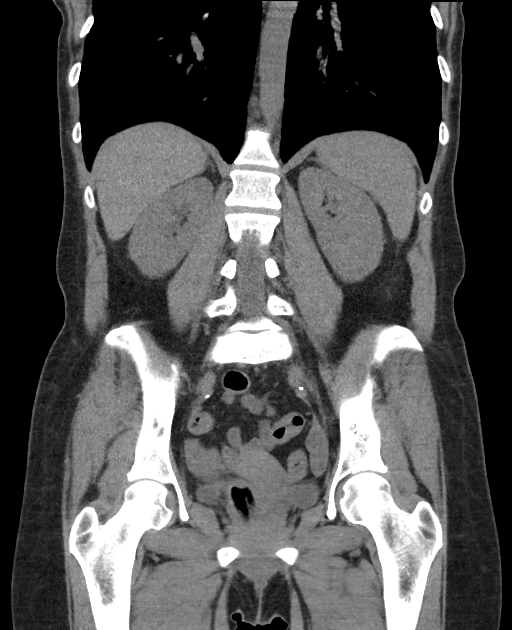

[16 of 46 positions shown; findings below may reference images not displayed]

FINDINGS: Evaluation of this exam is limited in the absence of intravenous
contrast.

Lower chest: The visualized lung bases are clear.

No intra-abdominal free air or free fluid.

Hepatobiliary: The liver is unremarkable. No intrahepatic biliary
ductal dilatation. Cholecystectomy. No retained calcified stone
noted in the central CBD.

Pancreas: Unremarkable. No pancreatic ductal dilatation or
surrounding inflammatory changes.

Spleen: Normal in size without focal abnormality.

Adrenals/Urinary Tract: The adrenal glands are unremarkable. There
is a punctate distal right ureteral calculus with mild right
hydronephrosis. The left kidney, left ureter, and urinary bladder
are unremarkable.

Stomach/Bowel: There is moderate stool throughout the colon. There
is no bowel obstruction or active inflammation. The appendix is
unremarkable as visualized.

Vascular/Lymphatic: Minimal atherosclerotic calcification of the
iliac arteries. The IVC is unremarkable. No portal venous gas. There
is no adenopathy.

Reproductive: The uterus is anteverted and grossly unremarkable. No
adnexal masses.

Other: None

Musculoskeletal: No acute or significant osseous findings.
IMPRESSION: A punctate distal right ureteral calculus with mild right
hydronephrosis.
